# Patient Record
Sex: Female | Born: 1985 | Race: White | Hispanic: No | Marital: Single | Smoking: Former smoker
Health system: Southern US, Community
[De-identification: ages and names within clinical notes are randomized; demographics above are authoritative.]

## PROBLEM LIST (undated history)

## (undated) ENCOUNTER — Ambulatory Visit (HOSPITAL_COMMUNITY): Admission: EM | Payer: No Typology Code available for payment source | Source: Home / Self Care

## (undated) DIAGNOSIS — M069 Rheumatoid arthritis, unspecified: Secondary | ICD-10-CM

## (undated) DIAGNOSIS — M797 Fibromyalgia: Secondary | ICD-10-CM

## (undated) DIAGNOSIS — I00 Rheumatic fever without heart involvement: Secondary | ICD-10-CM

## (undated) DIAGNOSIS — F909 Attention-deficit hyperactivity disorder, unspecified type: Secondary | ICD-10-CM

## (undated) DIAGNOSIS — G43909 Migraine, unspecified, not intractable, without status migrainosus: Secondary | ICD-10-CM

## (undated) DIAGNOSIS — F419 Anxiety disorder, unspecified: Secondary | ICD-10-CM

## (undated) DIAGNOSIS — F329 Major depressive disorder, single episode, unspecified: Secondary | ICD-10-CM

## (undated) DIAGNOSIS — F32A Depression, unspecified: Secondary | ICD-10-CM

## (undated) DIAGNOSIS — F4310 Post-traumatic stress disorder, unspecified: Secondary | ICD-10-CM

## (undated) HISTORY — PX: OTHER SURGICAL HISTORY: SHX169

---
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
## 2005-01-16 ENCOUNTER — Other Ambulatory Visit: Admission: RE | Admit: 2005-01-16 | Discharge: 2005-01-16 | Payer: Self-pay | Admitting: Family Medicine

## 2005-04-20 ENCOUNTER — Emergency Department (HOSPITAL_COMMUNITY): Admission: EM | Admit: 2005-04-20 | Discharge: 2005-04-20 | Payer: Self-pay | Admitting: *Deleted

## 2014-08-24 ENCOUNTER — Emergency Department (HOSPITAL_COMMUNITY)
Admission: EM | Admit: 2014-08-24 | Discharge: 2014-08-24 | Disposition: A | Discharge status: Home / Self Care | Payer: Non-veteran care | Attending: Emergency Medicine | Admitting: Emergency Medicine

## 2014-08-24 ENCOUNTER — Encounter (HOSPITAL_COMMUNITY): Payer: Self-pay | Admitting: Emergency Medicine

## 2014-08-24 DIAGNOSIS — Z720 Tobacco use: Secondary | ICD-10-CM | POA: Diagnosis not present

## 2014-08-24 DIAGNOSIS — F419 Anxiety disorder, unspecified: Secondary | ICD-10-CM | POA: Insufficient documentation

## 2014-08-24 DIAGNOSIS — R52 Pain, unspecified: Secondary | ICD-10-CM | POA: Diagnosis present

## 2014-08-24 DIAGNOSIS — T43215A Adverse effect of selective serotonin and norepinephrine reuptake inhibitors, initial encounter: Secondary | ICD-10-CM | POA: Diagnosis not present

## 2014-08-24 DIAGNOSIS — Y9289 Other specified places as the place of occurrence of the external cause: Secondary | ICD-10-CM | POA: Insufficient documentation

## 2014-08-24 DIAGNOSIS — Z79899 Other long term (current) drug therapy: Secondary | ICD-10-CM | POA: Diagnosis not present

## 2014-08-24 DIAGNOSIS — R1110 Vomiting, unspecified: Secondary | ICD-10-CM | POA: Insufficient documentation

## 2014-08-24 DIAGNOSIS — F329 Major depressive disorder, single episode, unspecified: Secondary | ICD-10-CM | POA: Insufficient documentation

## 2014-08-24 DIAGNOSIS — Y998 Other external cause status: Secondary | ICD-10-CM | POA: Diagnosis not present

## 2014-08-24 DIAGNOSIS — Y9389 Activity, other specified: Secondary | ICD-10-CM | POA: Insufficient documentation

## 2014-08-24 DIAGNOSIS — T50905A Adverse effect of unspecified drugs, medicaments and biological substances, initial encounter: Secondary | ICD-10-CM

## 2014-08-24 HISTORY — DX: Major depressive disorder, single episode, unspecified: F32.9

## 2014-08-24 HISTORY — DX: Depression, unspecified: F32.A

## 2014-08-24 LAB — CBC
HCT: 38 % (ref 36.0–46.0)
Hemoglobin: 13.4 g/dL (ref 12.0–15.0)
MCH: 32.7 pg (ref 26.0–34.0)
MCHC: 35.3 g/dL (ref 30.0–36.0)
MCV: 92.7 fL (ref 78.0–100.0)
Platelets: 253 10*3/uL (ref 150–400)
RBC: 4.1 MIL/uL (ref 3.87–5.11)
RDW: 12.2 % (ref 11.5–15.5)
WBC: 8.9 10*3/uL (ref 4.0–10.5)

## 2014-08-24 LAB — COMPREHENSIVE METABOLIC PANEL
ALT: 27 U/L (ref 0–35)
AST: 22 U/L (ref 0–37)
Albumin: 4.1 g/dL (ref 3.5–5.2)
Alkaline Phosphatase: 66 U/L (ref 39–117)
Anion gap: 15 (ref 5–15)
BUN: 9 mg/dL (ref 6–23)
CO2: 21 mEq/L (ref 19–32)
Calcium: 9.2 mg/dL (ref 8.4–10.5)
Chloride: 98 mEq/L (ref 96–112)
Creatinine, Ser: 0.63 mg/dL (ref 0.50–1.10)
GFR calc Af Amer: >90 mL/min (ref >90)
GFR calc non Af Amer: >90 mL/min (ref >90)
Glucose, Bld: 91 mg/dL (ref 70–99)
Potassium: 3.8 mEq/L (ref 3.7–5.3)
Sodium: 134 mEq/L — ABNORMAL LOW (ref 137–147)
Total Bilirubin: 0.3 mg/dL (ref 0.3–1.2)
Total Protein: 7.6 g/dL (ref 6.0–8.3)

## 2014-08-24 MED ORDER — HYDROMORPHONE HCL 1 MG/ML IJ SOLN
1.0000 mg | Freq: Once | INTRAMUSCULAR | Status: AC
  Administered 2014-08-24: 1 mg via INTRAVENOUS
  Filled 2014-08-24: qty 1

## 2014-08-24 MED ORDER — SODIUM CHLORIDE 0.9 % IV BOLUS (SEPSIS)
1000.0000 mL | Freq: Once | INTRAVENOUS | Status: AC
  Administered 2014-08-24: 1000 mL via INTRAVENOUS

## 2014-08-24 MED ORDER — CLONAZEPAM 0.5 MG PO TABS: 0.5000 mg | ORAL_TABLET | Freq: Two times a day (BID) | ORAL | Status: DC | PRN

## 2014-08-24 NOTE — ED Notes (Signed)
Pt alert and oriented x4. Respirations even and unlabored, bilateral symmetrical rise and fall of chest. Skin warm and dry. In no acute distress. Denies needs.   

## 2014-08-24 NOTE — ED Provider Notes (Signed)
CSN: [id number]     Arrival date & time 08/24/14  1355 History   First MD Initiated Contact with Patient 08/24/14 1731     Chief Complaint  Patient presents with  . Medication Reaction      HPI  Expand All Collapse All   Pt was being treated for PTSD and anxiety, has been on effexor, from Oct 11-Nov 9 pt was weaning herself off effexor per md, pt stopped effexor Nov 9, started Cymbalta Nov 11, pt had bad reaction. Pt did not take cymbalta anymore per md, pt started back on cymbalta 30mg  today. Took medicine at 0730, around 0900 pt started having pain from head to toes, confusion, disorientation, stomach cramping, and vomited x3      Past Medical History  Diagnosis Date  . Depression    History reviewed. No pertinent past surgical history. No family history on file. History  Substance Use Topics  . Smoking status: Current Every Day Smoker  . Smokeless tobacco: Not on file  . Alcohol Use: Yes   OB History    No data available     Review of Systems  All other systems reviewed and are negative  Allergies  Celebrex  Home Medications   Prior to Admission medications   Medication Sig Start Date End Date Taking? Authorizing Provider  DULoxetine (CYMBALTA) 30 MG capsule Take 30 mg by mouth daily.   Yes Historical Provider, MD  DULoxetine (CYMBALTA) 60 MG capsule Take 60 mg by mouth daily.   Yes Historical Provider, MD  etonogestrel (NEXPLANON) 68 MG IMPL implant 1 each by Subdermal route once. 05/2013   Yes Historical Provider, MD  clonazePAM (KLONOPIN) 0.5 MG tablet Take 1 tablet (0.5 mg total) by mouth 2 (two) times daily as needed for anxiety. 08/24/14   08/26/14, MD   BP 117/70 mmHg  Pulse 104  Temp(Src) 98.5 F (36.9 C) (Oral)  Resp 18  SpO2 100% Physical Exam Physical Exam  Nursing note and vitals reviewed. Constitutional: She is oriented to person, place, and time. She appears well-developed and well-nourished. No distress.  HENT:  Head: Normocephalic  and atraumatic.  Eyes: Pupils are equal, round, and reactive to light.  Neck: Normal range of motion.  Cardiovascular: Normal rate and intact distal pulses.   Pulmonary/Chest: No respiratory distress.  Abdominal: Normal appearance. She exhibits no distension.  Musculoskeletal: Normal range of motion.  Neurological: She is alert and oriented to person, place, and time. No cranial nerve deficit.  Skin: Skin is warm and dry. No rash noted.  Psychiatric: She has a normal mood and affect. Her behavior is normal.  no homicidal or suicidal ideation.  ED Course  Procedures (including critical care time) Labs Review Labs Reviewed  COMPREHENSIVE METABOLIC PANEL - Abnormal; Notable for the following:    Sodium 134 (*)    All other components within normal limits  CBC    Imaging Review No results found.   EKG Interpretation None      MDM   Final diagnoses:  Drug reaction, initial encounter        Nelia Shi, MD 08/30/14 2203

## 2014-08-24 NOTE — Progress Notes (Signed)
  CARE MANAGEMENT ED NOTE 08/24/2014  Patient:  CORRENA, MEACHAM   Account Number:  [id number]  Date Initiated:  08/24/2014  Documentation initiated by:  Radford Pax  Subjective/Objective Assessment:   Patient presents to Ed with questionable medication reaction     Subjective/Objective Assessment Detail:   Patient with pmhx of anxiety, depression and PTSD     Action/Plan:   Action/Plan Detail:   Anticipated DC Date:  08/24/2014     Status Recommendation to Physician:   Result of Recommendation:    Other ED Services  Consult Working Plan    DC Planning Services  Other  PCP issues    Choice offered to / List presented to:            Status of service:  Completed, signed off  ED Comments:   ED Comments Detail:  EDCM spoke to patient and patient's mother at bedside. Patient confirms she is being seen at [facility] in [place].  Her pcp at the [facility] is Dr. Kae Heller.  Patient requesting list of pcps who accept [employer] insurnace.  EDCM provided patient with a list of pcps who accept BCBS insurnace within a ten mile radius of patient's zip code.  Patient thankful for resources.  No further EDCM needs at this time.

## 2014-08-24 NOTE — ED Notes (Signed)
Per pt states she has been coming off effexor for a month and placed on cymbalta-states she has been confused and vomiting on and off since

## 2014-08-24 NOTE — ED Notes (Signed)
Pt was being treated for PTSD and anxiety, has been on effexor, from Oct 11-Nov 9 pt was weaning herself off effexor per md, pt stopped effexor Nov 9, started Cymbalta Nov 11, pt had bad reaction. Pt did not take cymbalta anymore per md, pt started back on cymbalta 30mg  today. Took medicine at 0730, around 0900 pt started having pain from head to toes, confusion, disorientation, stomach cramping, and vomited x3. Pain 6/10.

## 2014-08-24 NOTE — ED Notes (Signed)
Pt escorted to discharge window. Pt verbalized understanding discharge instructions. In no acute distress.  

## 2014-08-24 NOTE — ED Notes (Signed)
md at bedside

## 2016-02-07 ENCOUNTER — Encounter: Payer: Self-pay | Admitting: Advanced Practice Midwife

## 2016-08-29 ENCOUNTER — Emergency Department (HOSPITAL_COMMUNITY)
Admission: EM | Admit: 2016-08-29 | Discharge: 2016-08-30 | Disposition: A | Discharge status: Home / Self Care | Payer: Medicaid Other | Attending: Emergency Medicine | Admitting: Emergency Medicine

## 2016-08-29 ENCOUNTER — Encounter (HOSPITAL_COMMUNITY): Payer: Self-pay | Admitting: Emergency Medicine

## 2016-08-29 DIAGNOSIS — J029 Acute pharyngitis, unspecified: Secondary | ICD-10-CM | POA: Diagnosis present

## 2016-08-29 DIAGNOSIS — Z79899 Other long term (current) drug therapy: Secondary | ICD-10-CM | POA: Insufficient documentation

## 2016-08-29 DIAGNOSIS — F17210 Nicotine dependence, cigarettes, uncomplicated: Secondary | ICD-10-CM | POA: Diagnosis not present

## 2016-08-29 DIAGNOSIS — J020 Streptococcal pharyngitis: Secondary | ICD-10-CM | POA: Diagnosis not present

## 2016-08-29 HISTORY — DX: Post-traumatic stress disorder, unspecified: F43.10

## 2016-08-29 LAB — RAPID STREP SCREEN (MED CTR MEBANE ONLY): STREPTOCOCCUS, GROUP A SCREEN (DIRECT): POSITIVE — AB

## 2016-08-29 MED ORDER — PENICILLIN G BENZATHINE 1200000 UNIT/2ML IM SUSP
1.2000 10*6.[IU] | Freq: Once | INTRAMUSCULAR | Status: AC
  Administered 2016-08-29: 1.2 10*6.[IU] via INTRAMUSCULAR
  Filled 2016-08-29: qty 2

## 2016-08-29 NOTE — ED Triage Notes (Signed)
Pt reports sore throat and body aches that started this am.

## 2016-08-29 NOTE — ED Provider Notes (Addendum)
[facility]-EMERGENCY DEPT Provider Note   CSN: [id number] Arrival date & time: 08/29/16  2204     History   Chief Complaint Chief Complaint  Patient presents with  . Sore Throat    HPI Barbara Koch is a 30 y.o. female.  The history is provided by the patient. No language interpreter was used.  Sore Throat  This is a new problem. The current episode started 12 to 24 hours ago. The problem occurs constantly. The problem has been rapidly worsening. Pertinent negatives include no chest pain. Nothing aggravates the symptoms. Nothing relieves the symptoms. She has tried nothing for the symptoms. The treatment provided no relief.  Pt complains of a sore throat  Past Medical History:  Diagnosis Date  . Depression   . PTSD (post-traumatic stress disorder)     There are no active problems to display for this patient.   History reviewed. No pertinent surgical history.  OB History    Gravida Para Term Preterm AB Living   3 1 1   2      SAB TAB Ectopic Multiple Live Births     2             Home Medications    Prior to Admission medications   Medication Sig Start Date End Date Taking? Authorizing Provider  clonazePAM (KLONOPIN) 0.5 MG tablet Take 1 tablet (0.5 mg total) by mouth 2 (two) times daily as needed for anxiety. 08/24/14   08/26/14, MD  DULoxetine (CYMBALTA) 30 MG capsule Take 30 mg by mouth daily.    Historical Provider, MD  DULoxetine (CYMBALTA) 60 MG capsule Take 60 mg by mouth daily.    Historical Provider, MD  etonogestrel (NEXPLANON) 68 MG IMPL implant 1 each by Subdermal route once. 05/2013    Historical Provider, MD    Family History Family History  Problem Relation Age of Onset  . Depression Mother   . Cancer Father   . Cancer Other   . Diabetes Other     Social History Social History  Substance Use Topics  . Smoking status: Current Every Day Smoker    Packs/day: 1.00    Types: Cigarettes  . Smokeless tobacco: Never Used  . Alcohol use Yes     Comment: occas     Allergies   Celebrex [celecoxib]   Review of Systems Review of Systems  Cardiovascular: Negative for chest pain.  All other systems reviewed and are negative.    Physical Exam Updated Vital Signs BP 138/83   Pulse 118   Temp 100.3 F (37.9 C)   Resp 20   Ht 5' (1.524 m)   Wt 81.6 kg   SpO2 100%   BMI 35.15 kg/m   Physical Exam  Constitutional: She appears well-developed and well-nourished.  HENT:  Head: Normocephalic.  Right Ear: External ear normal.  Left Ear: External ear normal.  Nose: Nose normal.  Erythema pharynx.    Eyes: EOM are normal. Pupils are equal, round, and reactive to light.  Neck: Normal range of motion.  Cardiovascular: Normal rate.   Pulmonary/Chest: Effort normal.  Abdominal: Soft.  Musculoskeletal: Normal range of motion.  Neurological: She is alert.  Skin: Skin is warm. Capillary refill takes less than 2 seconds.  Psychiatric: She has a normal mood and affect.  Nursing note and vitals reviewed.    ED Treatments / Results  Labs (all labs ordered are listed, but only abnormal results are displayed) Labs Reviewed  RAPID STREP SCREEN (NOT AT [facility]) -  Abnormal; Notable for the following:       Result Value   Streptococcus, Group A Screen (Direct) POSITIVE (*)    All other components within normal limits    EKG  EKG Interpretation None       Radiology No results found.  Procedures Procedures (including critical care time)  Medications Ordered in ED Medications  penicillin g benzathine (BICILLIN LA) 1200000 UNIT/2ML injection 1.2 Million Units (not administered)     Initial Impression / Assessment and Plan / ED Course  I have reviewed the triage vital signs and the nursing notes.  Pertinent labs & imaging results that were available during my care of the patient were reviewed by me and considered in my medical decision making (see chart for details).  Clinical Course     Strep positive  Final  Clinical Impressions(s) / ED Diagnoses   Final diagnoses:  Strep pharyngitis    New Prescriptions New Prescriptions   No medications on file  [facility]   Elson Areas, PA-C 08/29/16 2325    Glynn Octave, MD 08/30/16 0257    Elson Areas, PA-C 09/04/16 1135    Glynn Octave, MD 09/04/16 2235

## 2016-08-29 NOTE — Discharge Instructions (Signed)
Return if any problems.

## 2017-10-26 ENCOUNTER — Encounter: Payer: Self-pay | Admitting: *Deleted

## 2017-10-26 ENCOUNTER — Telehealth: Payer: Self-pay | Admitting: *Deleted

## 2017-10-26 NOTE — Telephone Encounter (Signed)
Message sent on wrong patient.

## 2017-11-09 ENCOUNTER — Encounter: Payer: Self-pay | Admitting: Obstetrics and Gynecology

## 2017-11-09 ENCOUNTER — Ambulatory Visit (INDEPENDENT_AMBULATORY_CARE_PROVIDER_SITE_OTHER): Payer: Medicaid Other | Admitting: Obstetrics and Gynecology

## 2017-11-09 ENCOUNTER — Other Ambulatory Visit: Payer: Self-pay | Admitting: Obstetrics and Gynecology

## 2017-11-09 VITALS — BP 120/90 | HR 98 | Ht 60.0 in | Wt 171.6 lb

## 2017-11-09 DIAGNOSIS — Z3202 Encounter for pregnancy test, result negative: Secondary | ICD-10-CM | POA: Diagnosis not present

## 2017-11-09 DIAGNOSIS — R87612 Low grade squamous intraepithelial lesion on cytologic smear of cervix (LGSIL): Secondary | ICD-10-CM

## 2017-11-09 LAB — POCT URINE PREGNANCY: Preg Test, Ur: NEGATIVE

## 2017-11-09 NOTE — Progress Notes (Signed)
  Barbara Koch 32 y.o. [id number] former [employer] with history of ASCUS Pap in 2016 and recent Pap showing L SIL here for colposcopy for low-grade squamous intraepithelial neoplasia (LGSIL - encompassing HPV,mild dysplasia,CIN I) pap smear on late 2018.  Discussed role for HPV in cervical dysplasia, need for surveillance.  Patient given informed consent, signed copy in the chart, time out was performed.  Placed in lithotomy position. Cervix viewed with speculum and colposcope after application of acetic acid.   Colposcopy adequate? Yes multiparous cervix able to view endocervical canal well ECC done  visible lesion(s) at 12 o'clock; biopsies obtained at 12.   ECC specimen obtained.yes All specimens were labelled and sent to pathology.  Colposcopy IMPRESSION:  Patient was given post procedure instructions. Will follow up pathology and manage accordingly.  Routine preventative health maintenance measures emphasized. Pt to have f/u  1 wk. To discuss results.

## 2017-11-13 ENCOUNTER — Encounter: Payer: Self-pay | Admitting: Obstetrics and Gynecology

## 2017-11-25 ENCOUNTER — Ambulatory Visit: Payer: Medicaid Other | Admitting: Obstetrics and Gynecology

## 2018-06-06 DIAGNOSIS — I00 Rheumatic fever without heart involvement: Secondary | ICD-10-CM

## 2018-06-06 HISTORY — DX: Rheumatic fever without heart involvement: I00

## 2018-09-01 ENCOUNTER — Other Ambulatory Visit: Payer: Self-pay

## 2018-09-01 ENCOUNTER — Emergency Department (HOSPITAL_COMMUNITY): Payer: Medicaid Other

## 2018-09-01 ENCOUNTER — Emergency Department (HOSPITAL_COMMUNITY)
Admission: EM | Admit: 2018-09-01 | Discharge: 2018-09-01 | Disposition: A | Discharge status: Home / Self Care | Payer: Medicaid Other | Attending: Emergency Medicine | Admitting: Emergency Medicine

## 2018-09-01 ENCOUNTER — Encounter (HOSPITAL_COMMUNITY): Payer: Self-pay

## 2018-09-01 DIAGNOSIS — M25512 Pain in left shoulder: Secondary | ICD-10-CM | POA: Diagnosis not present

## 2018-09-01 DIAGNOSIS — M069 Rheumatoid arthritis, unspecified: Secondary | ICD-10-CM | POA: Diagnosis not present

## 2018-09-01 DIAGNOSIS — Z79899 Other long term (current) drug therapy: Secondary | ICD-10-CM | POA: Insufficient documentation

## 2018-09-01 DIAGNOSIS — F17210 Nicotine dependence, cigarettes, uncomplicated: Secondary | ICD-10-CM | POA: Diagnosis not present

## 2018-09-01 HISTORY — DX: Migraine, unspecified, not intractable, without status migrainosus: G43.909

## 2018-09-01 HISTORY — DX: Anxiety disorder, unspecified: F41.9

## 2018-09-01 HISTORY — DX: Rheumatoid arthritis, unspecified: M06.9

## 2018-09-01 HISTORY — DX: Attention-deficit hyperactivity disorder, unspecified type: F90.9

## 2018-09-01 MED ORDER — IBUPROFEN 800 MG PO TABS: 800.0000 mg | ORAL_TABLET | Freq: Three times a day (TID) | ORAL | 0 refills | Status: DC

## 2018-09-01 MED ORDER — HYDROCODONE-ACETAMINOPHEN 5-325 MG PO TABS: ORAL_TABLET | ORAL | 0 refills | Status: DC

## 2018-09-01 NOTE — ED Triage Notes (Signed)
Pt reports right shoulder pain x 4 days. No known injury.

## 2018-09-01 NOTE — Discharge Instructions (Addendum)
Continue to alternate ice and heat to your shoulder.  Only wear the sling for a few hours at a time and do not wear continuously.  Call the orthopedic provider listed to arrange a follow-up appointment if not improving in a few days.

## 2018-09-02 NOTE — ED Provider Notes (Signed)
[facility] EMERGENCY DEPARTMENT Provider Note   CSN: [id number] Arrival date & time: 09/01/18  1356     History   Chief Complaint Chief Complaint  Patient presents with  . Shoulder Pain    HPI Barbara Koch is a 32 y.o. female.  HPI   Barbara Koch is a 32 y.o. female who presents to the Emergency Department complaining of shoulder pain for 4 to 5 days.  She describes the pain as a constant aching pain of her shoulder that is worse with movement of her right arm.  She denies known injury, neck pain, numbness or tingling of her arm or fingers.  Pain does radiate into her right upper arm at times.  She is tried over-the-counter pain relievers without improvement.  No headaches or dizziness or skin changes.   Past Medical History:  Diagnosis Date  . ADHD   . Anxiety   . Depression   . Migraines   . PTSD (post-traumatic stress disorder)   . Rheumatoid arthritis (HCC)     There are no active problems to display for this patient.   Past Surgical History:  Procedure Laterality Date  . dislocated shoulder     right     OB History    Gravida  3   Para  1   Term  1   Preterm      AB  2   Living        SAB      TAB  2   Ectopic      Multiple      Live Births               Home Medications    Prior to Admission medications   Medication Sig Start Date End Date Taking? Authorizing Provider  clonazePAM (KLONOPIN) 0.5 MG tablet Take 1 tablet (0.5 mg total) by mouth 2 (two) times daily as needed for anxiety. 08/24/14   Nelva Nay, MD  DULoxetine (CYMBALTA) 30 MG capsule Take 30 mg by mouth daily.    [provider]  DULoxetine (CYMBALTA) 60 MG capsule Take 60 mg by mouth daily.    [provider]  etonogestrel (NEXPLANON) 68 MG IMPL implant 1 each by Subdermal route once. 05/2013    [provider]  HYDROcodone-acetaminophen (NORCO/VICODIN) 5-325 MG tablet Take one tab po q 4 hrs prn pain 09/01/18   Jerri Glauser,  PA-C  ibuprofen (ADVIL,MOTRIN) 800 MG tablet Take 1 tablet (800 mg total) by mouth 3 (three) times daily. 09/01/18   Mandeep Kiser, PA-C  lisdexamfetamine (VYVANSE) 40 MG capsule Take 40 mg by mouth every morning.    [provider]    Family History Family History  Problem Relation Age of Onset  . Depression Mother   . Cancer Father   . Cancer Other   . Diabetes Other     Social History Social History   Tobacco Use  . Smoking status: Current Every Day Smoker    Packs/day: 0.50    Types: Cigarettes  . Smokeless tobacco: Never Used  Substance Use Topics  . Alcohol use: Yes    Comment: occas  . Drug use: No     Allergies   Celebrex [celecoxib] and Sulfa antibiotics   Review of Systems Review of Systems  Constitutional: Negative for chills and fever.  Respiratory: Negative for chest tightness and shortness of breath.   Cardiovascular: Negative for chest pain.  Musculoskeletal: Positive for arthralgias (Right shoulder pain). Negative for  joint swelling.  Skin: Negative for color change and wound.  Neurological: Negative for dizziness, weakness, numbness and headaches.     Physical Exam Updated Vital Signs BP (!) 134/98   Pulse 83   Temp 98.6 F (37 C) (Oral)   Resp 16   Ht 5' (1.524 m)   Wt 79.4 kg   SpO2 97%   BMI 34.18 kg/m   Physical Exam  Constitutional: She appears well-nourished. No distress.  HENT:  Head: Atraumatic.  Mouth/Throat: Oropharynx is clear and moist.  Neck: Normal range of motion, full passive range of motion without pain and phonation normal. No spinous process tenderness and no muscular tenderness present. Normal range of motion present.  Cardiovascular: Normal rate, regular rhythm and intact distal pulses.  Pulmonary/Chest: Effort normal and breath sounds normal. No respiratory distress. She exhibits no tenderness.  Musculoskeletal: Normal range of motion.       Right shoulder: She exhibits tenderness. She exhibits no bony  tenderness, no swelling, no deformity, normal pulse and normal strength.  Tender to palpation of the right AC joint.  No bony deformity. Mild tenderness of the right trapezius muscle. No edema.  No cervical spine tenderness.  Grip strength strong and symmetrical  Neurological: She is alert. No sensory deficit.  Skin: Skin is warm. No rash noted.  Nursing note and vitals reviewed.    ED Treatments / Results  Labs (all labs ordered are listed, but only abnormal results are displayed) Labs Reviewed - No data to display  EKG None  Radiology Dg Shoulder Right  Result Date: 09/01/2018 CLINICAL DATA:  Right shoulder pain and limited range of motion for 4 days. EXAM: RIGHT SHOULDER - 2+ VIEW COMPARISON:  None. FINDINGS: There is no evidence of fracture or dislocation. There is no evidence of arthropathy or other focal bone abnormality. Soft tissues are unremarkable. IMPRESSION: Negative right shoulder radiographs. Electronically Signed   By: Marin Roberts M.D.   On: 09/01/2018 14:54    Procedures Procedures (including critical care time)  Medications Ordered in ED Medications - No data to display   Initial Impression / Assessment and Plan / ED Course  I have reviewed the triage vital signs and the nursing notes.  Pertinent labs & imaging results that were available during my care of the patient were reviewed by me and considered in my medical decision making (see chart for details).     Pt with right shoulder pain.  NV intact.  No concerning sx's for septic joint.  No skin changes.  XR neg.  Likely inflammatory.  Pt agrees to tx plan and orthopedic f/u.    Final Clinical Impressions(s) / ED Diagnoses   Final diagnoses:  Acute pain of left shoulder    ED Discharge Orders         Ordered    ibuprofen (ADVIL,MOTRIN) 800 MG tablet  3 times daily     09/01/18 1519    HYDROcodone-acetaminophen (NORCO/VICODIN) 5-325 MG tablet     09/01/18 1519           Pauline Aus, PA-C 09/02/18 2219    Donnetta Hutching, MD 09/03/18 [phone]

## 2019-02-09 ENCOUNTER — Encounter (HOSPITAL_COMMUNITY): Payer: Self-pay | Admitting: Emergency Medicine

## 2019-02-09 ENCOUNTER — Emergency Department (HOSPITAL_COMMUNITY)
Admission: EM | Admit: 2019-02-09 | Discharge: 2019-02-09 | Disposition: A | Discharge status: Home / Self Care | Payer: Medicaid Other | Attending: Emergency Medicine | Admitting: Emergency Medicine

## 2019-02-09 ENCOUNTER — Other Ambulatory Visit: Payer: Self-pay

## 2019-02-09 DIAGNOSIS — F17210 Nicotine dependence, cigarettes, uncomplicated: Secondary | ICD-10-CM | POA: Insufficient documentation

## 2019-02-09 DIAGNOSIS — J329 Chronic sinusitis, unspecified: Secondary | ICD-10-CM | POA: Diagnosis not present

## 2019-02-09 DIAGNOSIS — J029 Acute pharyngitis, unspecified: Secondary | ICD-10-CM | POA: Diagnosis present

## 2019-02-09 DIAGNOSIS — Z79899 Other long term (current) drug therapy: Secondary | ICD-10-CM | POA: Diagnosis not present

## 2019-02-09 HISTORY — DX: Rheumatic fever without heart involvement: I00

## 2019-02-09 LAB — GROUP A STREP BY PCR: Group A Strep by PCR: NOT DETECTED

## 2019-02-09 MED ORDER — PREDNISONE 20 MG PO TABS
40.0000 mg | ORAL_TABLET | Freq: Once | ORAL | Status: AC
  Administered 2019-02-09: 40 mg via ORAL
  Filled 2019-02-09: qty 2

## 2019-02-09 MED ORDER — DEXAMETHASONE 4 MG PO TABS: 4.0000 mg | ORAL_TABLET | Freq: Two times a day (BID) | ORAL | 0 refills | Status: DC

## 2019-02-09 MED ORDER — ACETAMINOPHEN 500 MG PO TABS
1000.0000 mg | ORAL_TABLET | Freq: Once | ORAL | Status: AC
  Administered 2019-02-09: 1000 mg via ORAL
  Filled 2019-02-09: qty 2

## 2019-02-09 MED ORDER — PSEUDOEPHEDRINE HCL 60 MG PO TABS
60.0000 mg | ORAL_TABLET | Freq: Once | ORAL | Status: AC
  Administered 2019-02-09: 60 mg via ORAL
  Filled 2019-02-09: qty 1

## 2019-02-09 NOTE — ED Provider Notes (Signed)
[facility] EMERGENCY DEPARTMENT Provider Note   CSN: [id number] Arrival date & time: 02/09/19  2144    History   Chief Complaint Chief Complaint  Patient presents with  . Sore Throat    HPI Barbara Koch is a 33 y.o. female.      Sore Throat  This is a new problem. The current episode started more than 2 days ago. The problem occurs daily. The problem has been gradually worsening. Pertinent negatives include no chest pain, no abdominal pain and no shortness of breath. The symptoms are aggravated by swallowing. Nothing relieves the symptoms. She has tried acetaminophen for the symptoms. The treatment provided no relief.    Past Medical History:  Diagnosis Date  . ADHD   . Anxiety   . Depression   . Migraines   . PTSD (post-traumatic stress disorder)   . 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 Rheumatoid arthritis (HCC)     There are no active problems to display for this patient.   Past Surgical History:  Procedure Laterality Date  . dislocated shoulder     right     OB History    Gravida  3   Para  1   Term  1   Preterm      AB  2   Living        SAB      TAB  2   Ectopic      Multiple      Live Births               Home Medications    Prior to Admission medications   Medication Sig Start Date End Date Taking? Authorizing Provider  clonazePAM (KLONOPIN) 0.5 MG tablet Take 1 tablet (0.5 mg total) by mouth 2 (two) times daily as needed for anxiety. 08/24/14   Nelva Nay, MD  cloNIDine (CATAPRES) 0.1 MG tablet TAKE 1 TABLET BY MOUTH AT BEDTIME FOR SLEEP 01/25/19   [provider]  DULoxetine (CYMBALTA) 30 MG capsule Take 30 mg by mouth daily.    [provider]  DULoxetine (CYMBALTA) 60 MG capsule Take 60 mg by mouth daily.    [provider]  etonogestrel (NEXPLANON) 68 MG IMPL implant 1 each by Subdermal route once. 05/2013    [provider]  gabapentin (NEURONTIN) 300 MG capsule TAKE 1 CAPSULE BY MOUTH  TWICE DAILY FOR ANXIETY 01/25/19   [provider]  HYDROcodone-acetaminophen (NORCO/VICODIN) 5-325 MG tablet Take one tab po q 4 hrs prn pain 09/01/18   Triplett, Tammy, PA-C  ibuprofen (ADVIL,MOTRIN) 800 MG tablet Take 1 tablet (800 mg total) by mouth 3 (three) times daily. 09/01/18   Triplett, Tammy, PA-C  lisdexamfetamine (VYVANSE) 40 MG capsule Take 40 mg by mouth every morning.    [provider]  REXULTI 1 MG TABS TAKE 1 TABLET BY MOUTH AT BEDTIME FOR DEPRESSION 12/16/18   [provider]    Family History Family History  Problem Relation Age of Onset  . Depression Mother   . Cancer Father   . Cancer Other   . Diabetes Other     Social History Social History   Tobacco Use  . Smoking status: Current Every Day Smoker    Packs/day: 0.50    Types: Cigarettes  . Smokeless tobacco: Never Used  Substance Use Topics  . Alcohol use: Yes    Comment: occas  . Drug use: No     Allergies   Celebrex [celecoxib] and Sulfa  antibiotics   Review of Systems Review of Systems  Constitutional: Negative for activity change, appetite change, chills and fever.       All ROS Neg except as noted in HPI  HENT: Positive for congestion and sore throat. Negative for nosebleeds.   Eyes: Negative for photophobia and discharge.  Respiratory: Negative for cough, shortness of breath and wheezing.   Cardiovascular: Negative for chest pain and palpitations.  Gastrointestinal: Negative for abdominal pain and blood in stool.  Genitourinary: Negative for dysuria, frequency and hematuria.  Musculoskeletal: Negative for arthralgias, back pain and neck pain.  Skin: Negative.   Neurological: Negative for dizziness, seizures and speech difficulty.  Psychiatric/Behavioral: Negative for confusion and hallucinations.     Physical Exam Updated Vital Signs BP (!) 146/88 (BP Location: Right Arm)   Pulse 93   Temp 98.9 F (37.2 C) (Oral)   Resp 17   Ht 5' (1.524 m)   Wt 81.6  kg   SpO2 98%   BMI 35.15 kg/m   Physical Exam Vitals signs and nursing note reviewed.  Constitutional:      Appearance: She is well-developed. She is not toxic-appearing.  HENT:     Head: Normocephalic.     Right Ear: Tympanic membrane and external ear normal. Tympanic membrane is not erythematous.     Left Ear: Tympanic membrane and external ear normal. Tenderness present. Tympanic membrane is not erythematous.     Nose: Congestion present.     Mouth/Throat:     Pharynx: Uvula midline. Posterior oropharyngeal erythema present.  Eyes:     General: Lids are normal.     Pupils: Pupils are equal, round, and reactive to light.  Neck:     Musculoskeletal: Normal range of motion and neck supple.     Vascular: No carotid bruit.  Cardiovascular:     Rate and Rhythm: Normal rate and regular rhythm.     Pulses: Normal pulses.     Heart sounds: Normal heart sounds.  Pulmonary:     Effort: No respiratory distress.     Breath sounds: Normal breath sounds.  Abdominal:     General: Bowel sounds are normal.     Palpations: Abdomen is soft.     Tenderness: There is no abdominal tenderness. There is no guarding.  Musculoskeletal: Normal range of motion.  Lymphadenopathy:     Head:     Right side of head: No submandibular adenopathy.     Left side of head: No submandibular adenopathy.     Cervical: No cervical adenopathy.  Skin:    General: Skin is warm and dry.  Neurological:     Mental Status: She is alert and oriented to person, place, and time.     Cranial Nerves: No cranial nerve deficit.     Sensory: No sensory deficit.  Psychiatric:        Speech: Speech normal.      ED Treatments / Results  Labs (all labs ordered are listed, but only abnormal results are displayed) Labs Reviewed  GROUP A STREP BY PCR    EKG None  Radiology No results found.  Procedures Procedures (including critical care time)  Medications Ordered in ED Medications - No data to display    Initial Impression / Assessment and Plan / ED Course  I have reviewed the triage vital signs and the nursing notes.  Pertinent labs & imaging results that were available during my care of the patient were reviewed by me and considered in my medical  decision making (see chart for details).          Final Clinical Impressions(s) / ED Diagnoses MDM  Vital signs reviewed.  Pulse oximetry is 98% on room air.  Within normal limits by my interpretation.  The strep test is negative.  There is noted some nasal congestion on examination.  There is mild to moderate redness of the posterior pharynx.  No exudate noted.  The airway is patent.  The speech is understandable.  The examination suggest sinusitis and pharyngitis.  I have asked the patient to use her mask.  Of asked her to wash hands frequently.  She will use salt water gargles and Chloraseptic spray.  She will use Tylenol every 4 hours.  I have asked her to use Claritin-D every 12 hours, and have given her prescription for 4 days of steroid medication to assist with her sinusitis and pharyngitis issue.  Patient is in agreement with this plan.  Patient will return to the emergency department if any changes in her condition, problems, or concerns.   Final diagnoses:  Pharyngitis, unspecified etiology  Other sinusitis, unspecified chronicity    ED Discharge Orders    None       Ivery Quale, PA-C 02/10/19 0026    Cathren Laine, MD 02/10/19 1259

## 2019-02-09 NOTE — ED Triage Notes (Addendum)
Pt c/o sore throat and bilateral ear pain x 1 week, pt denies fever, reports some fatigue and body aches

## 2019-02-09 NOTE — Discharge Instructions (Signed)
Your strep test is negative.  Your vital signs are within normal limits.  Your oxygen level is within normal limits.  Please use Claritin-D every 12 hours for congestion, and to relieve some of the pressure off of your ears.  Please use Decadron 2 times daily.  Salt water gargles as needed, and Chloraseptic spray as needed for assistance with your throat.  Use Tylenol extra strength every 4 hours for soreness or discomfort.

## 2019-03-17 ENCOUNTER — Telehealth: Payer: Self-pay | Admitting: Orthopedic Surgery

## 2019-03-17 NOTE — Telephone Encounter (Signed)
Patient called about a left shoulder problem which recurred from not necessarily an injury. States similar problem in November which records indicate seen at [facility] emergency room, Xrays done then. Discussed and offered appointment, and also insurances - [place] and [employer], which would require referrals. Medicaid card indicates a practice, [facility], which patient said she has never been seen at.  Will check with insurances, primary care, and [facility], and will call back.

## 2019-09-12 ENCOUNTER — Telehealth: Payer: Medicaid Other

## 2019-09-12 ENCOUNTER — Ambulatory Visit (INDEPENDENT_AMBULATORY_CARE_PROVIDER_SITE_OTHER)
Admission: RE | Admit: 2019-09-12 | Discharge: 2019-09-12 | Disposition: A | Discharge status: Home / Self Care | Payer: Medicaid Other | Source: Ambulatory Visit

## 2019-09-12 ENCOUNTER — Other Ambulatory Visit: Payer: Self-pay

## 2019-09-12 DIAGNOSIS — M797 Fibromyalgia: Secondary | ICD-10-CM

## 2019-09-12 DIAGNOSIS — G44029 Chronic cluster headache, not intractable: Secondary | ICD-10-CM | POA: Diagnosis not present

## 2019-09-12 DIAGNOSIS — M069 Rheumatoid arthritis, unspecified: Secondary | ICD-10-CM | POA: Diagnosis not present

## 2019-09-12 MED ORDER — PREDNISONE 10 MG (21) PO TBPK: ORAL_TABLET | Freq: Every day | ORAL | 0 refills | Status: DC

## 2019-09-12 MED ORDER — DICLOFENAC POTASSIUM(MIGRAINE) 50 MG PO PACK: 50.0000 mg | PACK | Freq: Two times a day (BID) | ORAL | 0 refills | Status: DC | PRN

## 2019-09-12 MED ORDER — CYCLOBENZAPRINE HCL 5 MG PO TABS: 5.0000 mg | ORAL_TABLET | Freq: Every day | ORAL | 0 refills | Status: DC

## 2019-09-12 NOTE — Discharge Instructions (Signed)
May complete course of prednisone.  Flexeril at night as needed for tension, don't take with clonazepam.  Once prednisone is complete may use diclofenac for breakthrough pain. This is an antiinflammatory, take with food.  Please follow up with your primary care provider for recheck.  Please be seen in person for any worsening of symptoms.

## 2019-09-12 NOTE — ED Provider Notes (Signed)
Virtual Visit via Video Note:  Barbara Koch  initiated request for Telemedicine visit with [facility] team. I connected with Barbara Koch  on 09/12/2019 at 12:40 PM  for a synchronized telemedicine visit using a video enabled HIPPA compliant telemedicine application. I verified that I am speaking with Barbara Koch  using two identifiers. Zigmund Gottron, NP  was physically located in a [facility] Urgent care site and Barbara Koch was located at a different location.   The limitations of evaluation and management by telemedicine as well as the availability of in-person appointments were discussed. Patient was informed that she  may incur a bill ( including co-pay) for this virtual visit encounter. Barbara Koch  expressed understanding and gave verbal consent to proceed with virtual visit.     History of Present Illness:Barbara Koch  is a 33 y.o. female presents with complaints of cluster headaches. History of these, especially with season changes. History of fibromyalgia and RA as well. Has had a difficult time getting in to see her specialists. States it has been a while since her last episode of cluster headaches. She does take cymbalta which does seem to help. States has a chronic headache which never stops. However it has gotten more intense. Has been drinking more water. Limits ibuprofen to prevent rebound headache. Pain to both temples and across front of head. 1 week of this worse headache. Follows with Dr. Domingo Cocking. Some nausea, some aura, light and sound sensitivity. Fibromyalgia pain is generalized and everywhere. RA is primarily worse to her back, but also with hand pain. Hasn't been taking any allergy medications, denies sneezing or itchy eyes. Dizzy when pain is significantly worse, denies now. Eyes feel tired but no vision loss. Headache is similar to what she has experienced in the past. Prednisone and diclofenac  have helped in the past for similar episodes.   Past  Medical History:  Diagnosis Date  . ADHD   . Anxiety   . Depression   . Migraines   . PTSD (post-traumatic stress disorder)   . 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 Rheumatoid arthritis (HCC)     Allergies  Allergen Reactions  . Celebrex [Celecoxib] Hives    Can take other NSAIDs  . Sulfa Antibiotics     Hives         Observations/Objective: Alert, oriented, non toxic in appearance. Clear coherent speech without difficulty. No increased work of breathing visualized.  Eyes tracking normally, no obvious neurological findings.   Assessment and Plan: Prednisone pack now. May try flexeril at night, although not to be taken with clonazepam. Once prednisone is complete may use diclofenac for breakthrough pain. Encouraged follow up with her PCP. Return precautions provided. Patient verbalized understanding and agreeable to plan.    Follow Up Instructions:    I discussed the assessment and treatment plan with the patient. The patient was provided an opportunity to ask questions and all were answered. The patient agreed with the plan and demonstrated an understanding of the instructions.   The patient was advised to call back or seek an in-person evaluation if the symptoms worsen or if the condition fails to improve as anticipated.  I provided 15 minutes of non-face-to-face time during this encounter.    Zigmund Gottron, NP  09/12/2019 12:40 PM         Zigmund Gottron, NP 09/12/19 1533

## 2019-12-09 IMAGING — DX DG SHOULDER 2+V*R*
3 series · 3 of 3 positions shown · non-contrast
Comparison: None.

CLINICAL DATA: Right shoulder pain and limited range of motion for
4 days.

EXAM:
RIGHT SHOULDER - 2+ VIEW

[shoulder grashey]
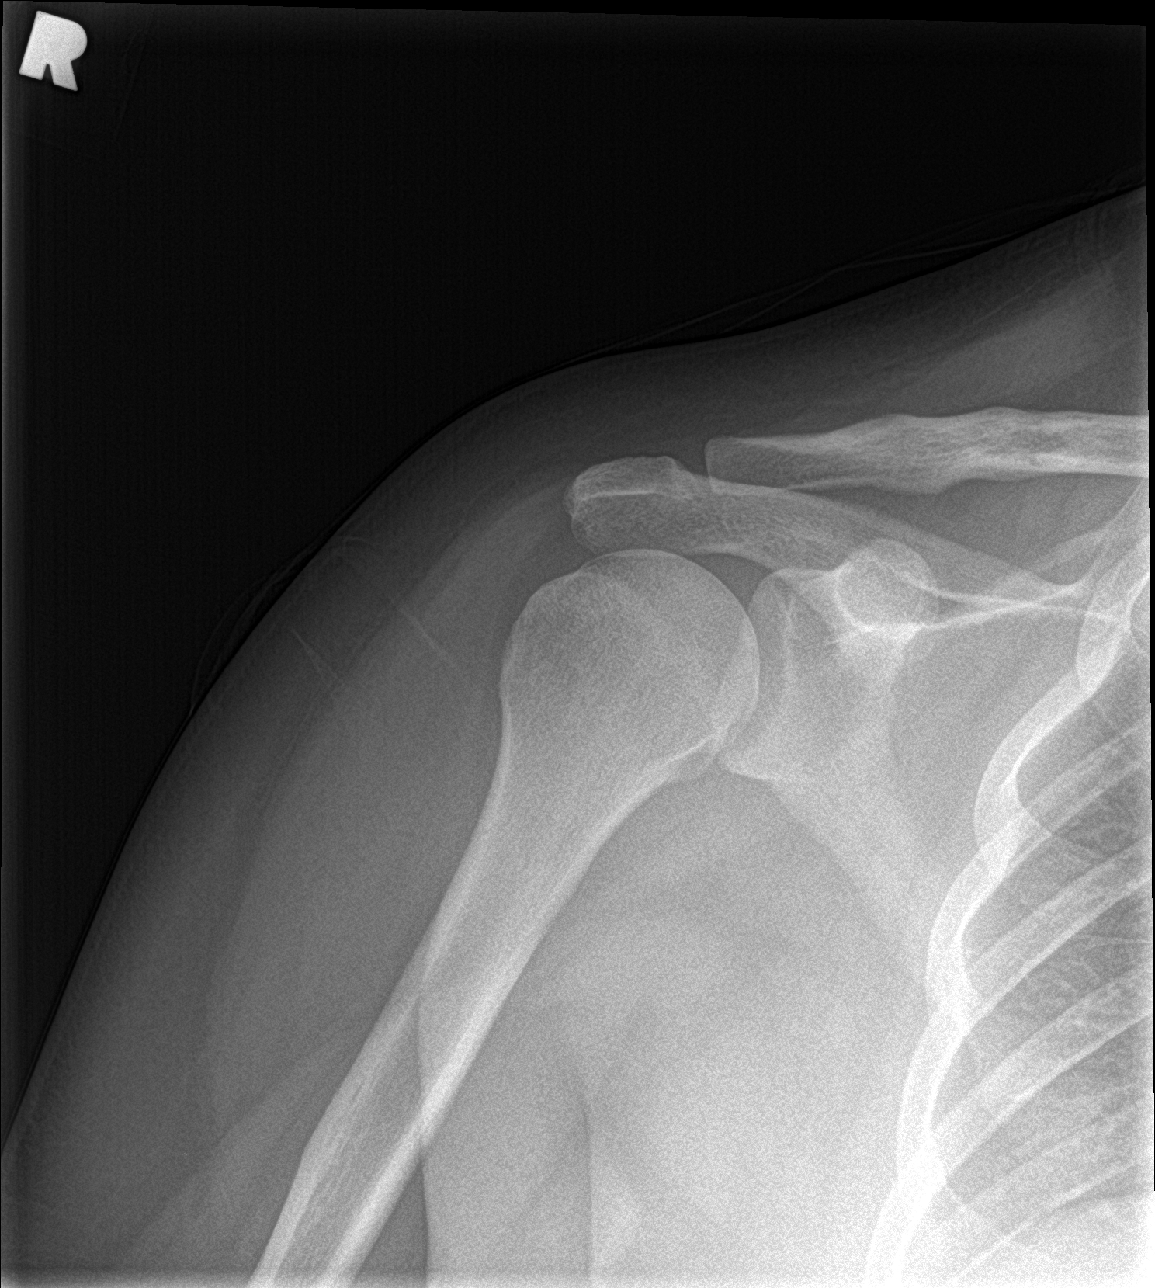

[shoulder y view]
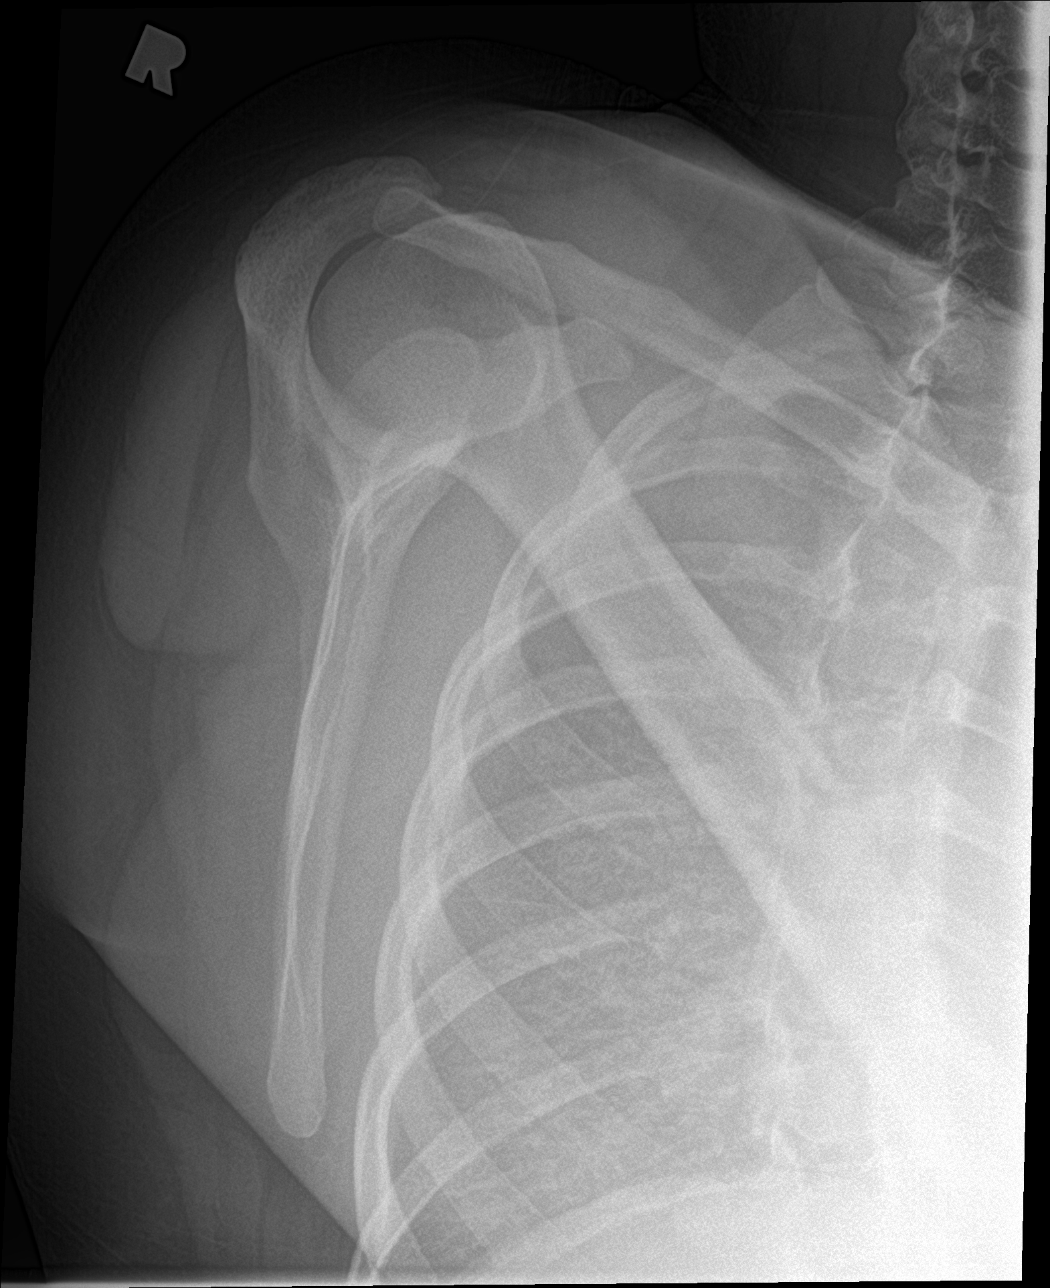

[shoulder axillary]
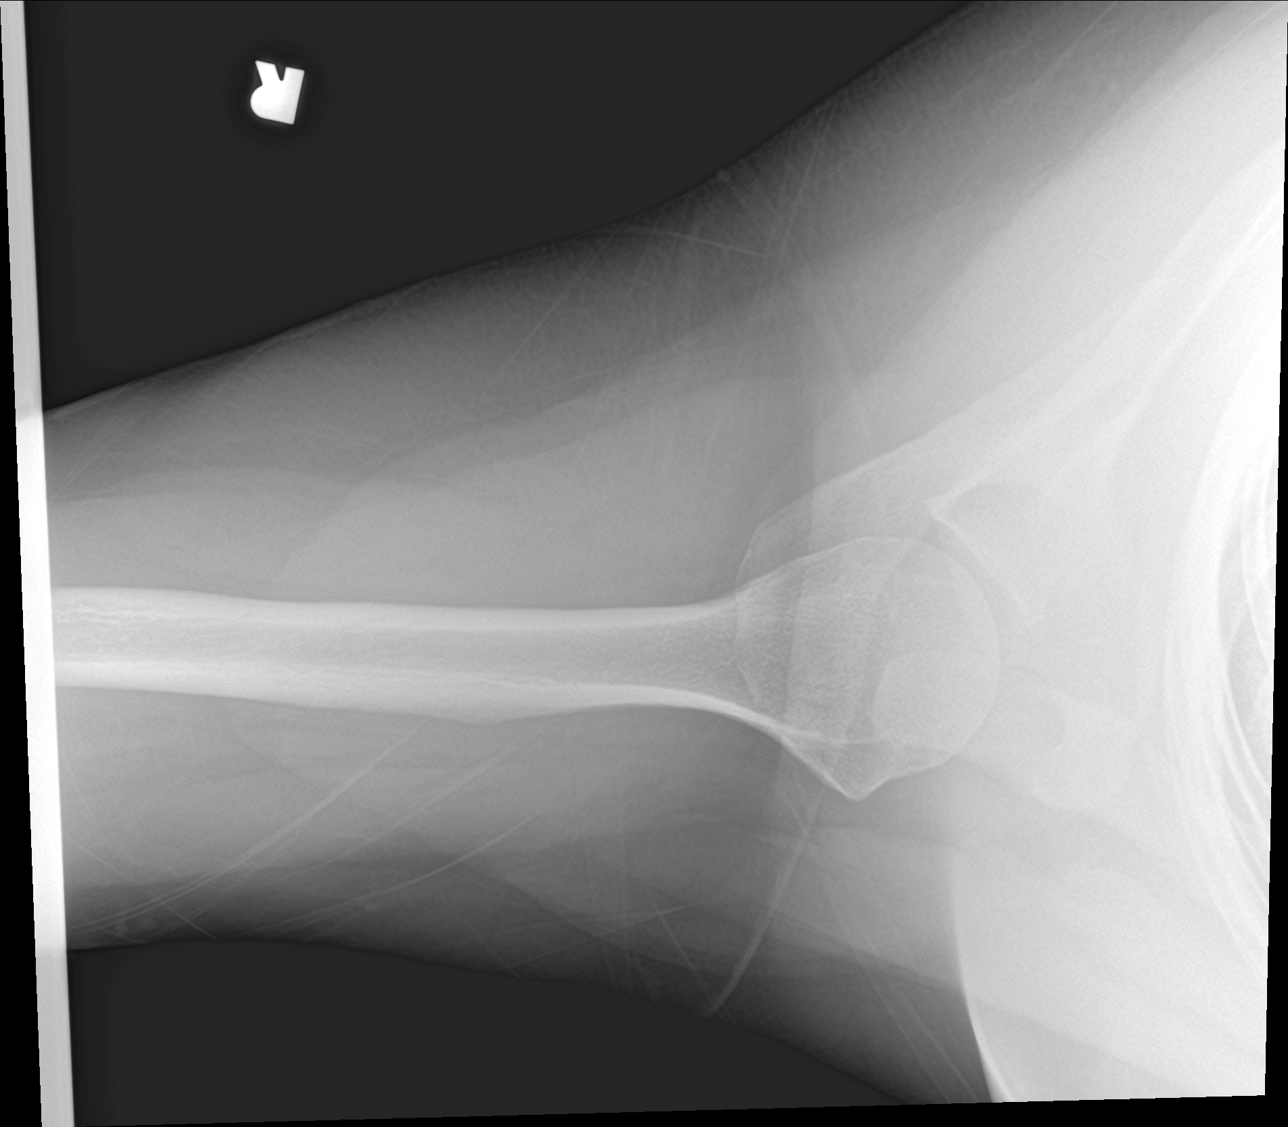

[3 of 3 positions shown; findings below may reference images not displayed]

FINDINGS: There is no evidence of fracture or dislocation. There is no
evidence of arthropathy or other focal bone abnormality. Soft
tissues are unremarkable.
IMPRESSION: Negative right shoulder radiographs.

## 2020-03-20 ENCOUNTER — Ambulatory Visit
Admission: EM | Admit: 2020-03-20 | Discharge: 2020-03-20 | Disposition: A | Discharge status: Home / Self Care | Payer: Medicaid Other | Attending: Emergency Medicine | Admitting: Emergency Medicine

## 2020-03-20 ENCOUNTER — Other Ambulatory Visit: Payer: Self-pay

## 2020-03-20 ENCOUNTER — Encounter: Payer: Self-pay | Admitting: Emergency Medicine

## 2020-03-20 DIAGNOSIS — Z760 Encounter for issue of repeat prescription: Secondary | ICD-10-CM | POA: Diagnosis not present

## 2020-03-20 DIAGNOSIS — Z113 Encounter for screening for infections with a predominantly sexual mode of transmission: Secondary | ICD-10-CM | POA: Diagnosis present

## 2020-03-20 DIAGNOSIS — N898 Other specified noninflammatory disorders of vagina: Secondary | ICD-10-CM | POA: Diagnosis not present

## 2020-03-20 HISTORY — DX: Fibromyalgia: M79.7

## 2020-03-20 LAB — POCT URINALYSIS DIP (MANUAL ENTRY)
Bilirubin, UA: NEGATIVE
Glucose, UA: NEGATIVE mg/dL
Ketones, POC UA: NEGATIVE mg/dL
Leukocytes, UA: NEGATIVE
Nitrite, UA: NEGATIVE
Protein Ur, POC: NEGATIVE mg/dL
Spec Grav, UA: 1.025 (ref 1.010–1.025)
Urobilinogen, UA: 0.2 E.U./dL
pH, UA: 5.5 (ref 5.0–8.0)

## 2020-03-20 MED ORDER — TRIAMCINOLONE ACETONIDE 0.1 % EX CREA: 1.0000 "application " | TOPICAL_CREAM | Freq: Two times a day (BID) | CUTANEOUS | 0 refills | Status: DC

## 2020-03-20 MED ORDER — METRONIDAZOLE 500 MG PO TABS: 500.0000 mg | ORAL_TABLET | Freq: Two times a day (BID) | ORAL | 0 refills | Status: DC

## 2020-03-20 NOTE — Discharge Instructions (Addendum)
  Vaginal self-swab obtained.  We will follow up with you regarding abnormal results Described metronidazole 500 mg orally twice a day for 7 days/Do not take alcohol while taking this medication. Triamcinolone was refilled Take medications as prescribed and to completion If tests results are positive, please abstain from sexual activity until you and your partner(s) have been treated Follow up with PCP or [facility] if symptoms persists Return here or go to ER if you have any new or worsening symptoms fever, chills, nausea, vomiting, abdominal or pelvic pain, painful intercourse, vaginal discharge, vaginal bleeding, persistent symptoms despite treatment, etc..Marland Kitchen

## 2020-03-20 NOTE — ED Provider Notes (Signed)
[place]   [id number] 03/20/20 Arrival Time: 1950   CC: STD screening   SUBJECTIVE:  Barbara Koch is a 34 y.o. female who presents to the urgent care with a complaint of vaginal itching, burning and foul smell for the past 2 to 3 days..  She denies a precipitating event, recent sexual encounter or recent antibiotic use.  Patient is sexually active with 1 female partner.  Denies vaginal discharge.  Has not tried any OTC medication.  Nothing made her symptoms worse.  Report similar symptoms in the past and was treated for BV.  She denies fever, chills, nausea, vomiting, abdominal or pelvic pain, urinary symptoms, vaginal bleeding, dyspareunia, vaginal rashes or lesions.   Patient want a refill on triamcinolone cream.   No LMP recorded. Patient has had an implant. Current birth control method: Compliant with BC:  ROS: As per HPI.  All other pertinent ROS negative.     Past Medical History:  Diagnosis Date  . ADHD   . Anxiety   . Depression   . Fibromyalgia   . Migraines   . PTSD (post-traumatic stress disorder)   . 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 Rheumatoid arthritis [facility])    Past Surgical History:  Procedure Laterality Date  . dislocated shoulder     right   Allergies  Allergen Reactions  . Celebrex [Celecoxib] Hives    Can take other NSAIDs  . Sulfa Antibiotics     Hives    No current facility-administered medications on file prior to encounter.   Current Outpatient Medications on File Prior to Encounter  Medication Sig Dispense Refill  . clonazePAM (KLONOPIN) 0.5 MG tablet Take 1 tablet (0.5 mg total) by mouth 2 (two) times daily as needed for anxiety. 30 tablet 0  . DULoxetine (CYMBALTA) 30 MG capsule Take 30 mg by mouth daily.    . DULoxetine (CYMBALTA) 60 MG capsule Take 60 mg by mouth daily.    Marland Kitchen etonogestrel (NEXPLANON) 68 MG IMPL implant 1 each by Subdermal route once. 05/2013    . gabapentin (NEURONTIN) 300 MG capsule TAKE 1 CAPSULE BY MOUTH  TWICE DAILY FOR ANXIETY    . ibuprofen (ADVIL,MOTRIN) 800 MG tablet Take 1 tablet (800 mg total) by mouth 3 (three) times daily. 21 tablet 0  . cloNIDine (CATAPRES) 0.1 MG tablet TAKE 1 TABLET BY MOUTH AT BEDTIME FOR SLEEP    . cyclobenzaprine (FLEXERIL) 5 MG tablet Take 1 tablet (5 mg total) by mouth at bedtime. 15 tablet 0  . dexamethasone (DECADRON) 4 MG tablet Take 1 tablet (4 mg total) by mouth 2 (two) times daily with a meal. 8 tablet 0  . Diclofenac Potassium,Migraine, 50 MG PACK Take 50 mg by mouth every 12 (twelve) hours as needed. 1 each 0  . HYDROcodone-acetaminophen (NORCO/VICODIN) 5-325 MG tablet Take one tab po q 4 hrs prn pain 12 tablet 0  . lisdexamfetamine (VYVANSE) 40 MG capsule Take 40 mg by mouth every morning.    . predniSONE (STERAPRED UNI-PAK 21 TAB) 10 MG (21) TBPK tablet Take by mouth daily. Per box instruction 21 tablet 0  . REXULTI 1 MG TABS TAKE 1 TABLET BY MOUTH AT BEDTIME FOR DEPRESSION      Social History   Socioeconomic History  . Marital status: Single    Spouse name: Not on file  . Number of children: Not on file  . Years of education: Not on file  . Highest education level: Not on file  Occupational History  .  Not on file  Tobacco Use  . Smoking status: Current Every Day Smoker    Packs/day: 1.00    Types: Cigarettes  . Smokeless tobacco: Never Used  Vaping Use  . Vaping Use: Never used  Substance and Sexual Activity  . Alcohol use: Yes    Comment: occas  . Drug use: Yes    Types: Marijuana    Comment: occ  . Sexual activity: Not Currently  Other Topics Concern  . Not on file  Social History Narrative  . Not on file   Social Determinants of Health   Financial Resource Strain:   . Difficulty of Paying Living Expenses:   Food Insecurity:   . Worried About Programme researcher, broadcasting/film/video in the Last Year:   . Barista in the Last Year:   Transportation Needs:   . Freight forwarder (Medical):   Marland Kitchen Lack of Transportation (Non-Medical):     Physical Activity:   . Days of Exercise per Week:   . Minutes of Exercise per Session:   Stress:   . Feeling of Stress :   Social Connections:   . Frequency of Communication with Friends and Family:   . Frequency of Social Gatherings with Friends and Family:   . Attends Religious Services:   . Active Member of Clubs or Organizations:   . Attends Banker Meetings:   Marland Kitchen Marital Status:   Intimate Partner Violence:   . Fear of Current or Ex-Partner:   . Emotionally Abused:   Marland Kitchen Physically Abused:   . Sexually Abused:    Family History  Problem Relation Age of Onset  . Depression Mother   . Cancer Father   . Cancer Other   . Diabetes Other     OBJECTIVE:  Vitals:   03/20/20 1422 03/20/20 1426  BP:  (!) 144/93  Pulse:  95  Resp:  18  Temp:  99.2 F (37.3 C)  TempSrc:  Oral  SpO2:  96%  Weight: 170 lb (77.1 kg)   Height: 5' (1.524 m)      General appearance: Alert, NAD, appears stated age Head: NCAT Throat: lips, mucosa, and tongue normal; teeth and gums normal Lungs: CTA bilaterally without adventitious breath sounds Heart: regular rate and rhythm.  Radial pulses 2+ symmetrical bilaterally Back: no CVA tenderness Abdomen: soft, non-tender; bowel sounds normal; no masses or organomegaly; no guarding or rebound tenderness GU:  Vaginal self swab obtained Skin: warm and dry Psychological:  Alert and cooperative. Normal mood and affect.  LABS:  Results for orders placed or performed during the hospital encounter of 02/09/19  Group A Strep by PCR   Specimen: Throat; Sterile Swab  Result Value Ref Range   Group A Strep by PCR NOT DETECTED NOT DETECTED    Labs Reviewed  URINE CULTURE  POCT URINALYSIS DIP (MANUAL ENTRY)  CERVICOVAGINAL ANCILLARY ONLY    ASSESSMENT & PLAN:  1. Screening for STD (sexually transmitted disease)   2. Medication refill   3. Vaginal itching    Patient is stable at discharge.  Symptom and past medical history was  reviewed.  We will  treat patient for possible BV.  Will await STD screening results.  Meds ordered this encounter  Medications  . metroNIDAZOLE (FLAGYL) 500 MG tablet    Sig: Take 1 tablet (500 mg total) by mouth 2 (two) times daily.    Dispense:  14 tablet    Refill:  0  . triamcinolone cream (KENALOG) 0.1 %  Sig: Apply 1 application topically 2 (two) times daily.    Dispense:  30 g    Refill:  0    Pending: Labs Reviewed  URINE CULTURE  POCT URINALYSIS DIP (MANUAL ENTRY)  CERVICOVAGINAL ANCILLARY ONLY    Discharge instructions  Vaginal self-swab obtained.  We will follow up with you regarding abnormal results Described metronidazole 500 mg orally twice a day for 7 days/Do not take alcohol while taking this medication. Triamcinolone was refilled Take medications as prescribed and to completion If tests results are positive, please abstain from sexual activity until you and your partner(s) have been treated Follow up with PCP or [facility] if symptoms persists Return here or go to ER if you have any new or worsening symptoms fever, chills, nausea, vomiting, abdominal or pelvic pain, painful intercourse, vaginal discharge, vaginal bleeding, persistent symptoms despite treatment, etc...  Reviewed expectations re: course of current medical issues. Questions answered. Outlined signs and symptoms indicating need for more acute intervention. Patient verbalized understanding. After Visit Summary given.   Note: This document was prepared using Dragon voice recognition software and may include unintentional dictation errors.     Durward Parcel, FNP 03/20/20 1455

## 2020-03-20 NOTE — ED Triage Notes (Signed)
Vaginal itching, burning and foul smell x 2-3 days ago . Denies any vaginal d/c or burning with urine.

## 2020-03-21 LAB — CERVICOVAGINAL ANCILLARY ONLY
Bacterial Vaginitis (gardnerella): POSITIVE — AB
Candida Glabrata: NEGATIVE
Candida Vaginitis: NEGATIVE
Chlamydia: NEGATIVE
Comment: NEGATIVE
Comment: NEGATIVE
Comment: NEGATIVE
Comment: NEGATIVE
Comment: NEGATIVE
Comment: NORMAL
Neisseria Gonorrhea: NEGATIVE
Trichomonas: NEGATIVE

## 2020-03-21 LAB — URINE CULTURE: Culture: 5000 — AB

## 2020-04-06 ENCOUNTER — Ambulatory Visit
Admission: EM | Admit: 2020-04-06 | Discharge: 2020-04-06 | Disposition: A | Discharge status: Home / Self Care | Payer: Non-veteran care | Attending: Emergency Medicine | Admitting: Emergency Medicine

## 2020-04-06 ENCOUNTER — Other Ambulatory Visit: Payer: Self-pay

## 2020-04-06 ENCOUNTER — Encounter: Payer: Self-pay | Admitting: Emergency Medicine

## 2020-04-06 ENCOUNTER — Ambulatory Visit: Payer: Self-pay

## 2020-04-06 DIAGNOSIS — H9202 Otalgia, left ear: Secondary | ICD-10-CM

## 2020-04-06 DIAGNOSIS — H65191 Other acute nonsuppurative otitis media, right ear: Secondary | ICD-10-CM | POA: Diagnosis not present

## 2020-04-06 MED ORDER — FLUTICASONE PROPIONATE 50 MCG/ACT NA SUSP: 1.0000 | Freq: Every day | NASAL | 0 refills | Status: DC

## 2020-04-06 MED ORDER — CIPROFLOXACIN-DEXAMETHASONE 0.3-0.1 % OT SUSP: 4.0000 [drp] | Freq: Two times a day (BID) | OTIC | 0 refills | Status: DC

## 2020-04-06 NOTE — ED Provider Notes (Signed)
[name]   [id number] 04/06/20 Arrival Time: 1536  Chief Complaint  Patient presents with  . Otalgia     SUBJECTIVE: History from: patient.  Barbara Koch is a 34 y.o. female who presented to the urgent care with a complaint of left ear pain for the past 3 days.  Reports a Q-tip  went further in her ear.   Patient states the pain is constant and achy in character.  Has not tried any OTC medication.  Denies aggravating factors.  Denies similar symptoms in the past.  Denies fever, chills, fatigue, sinus pain, rhinorrhea, ear discharge, sore throat, SOB, wheezing, chest pain, nausea, changes in bowel or bladder habits.    ROS: As per HPI.  All other pertinent ROS negative.     Past Medical History:  Diagnosis Date  . ADHD   . Anxiety   . Depression   . Fibromyalgia   . Migraines   . PTSD (post-traumatic stress disorder)   . 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 Rheumatoid arthritis [facility])    Past Surgical History:  Procedure Laterality Date  . dislocated shoulder     right   Allergies  Allergen Reactions  . Celebrex [Celecoxib] Hives    Can take other NSAIDs  . Sulfa Antibiotics     Hives    No current facility-administered medications on file prior to encounter.   Current Outpatient Medications on File Prior to Encounter  Medication Sig Dispense Refill  . clonazePAM (KLONOPIN) 0.5 MG tablet Take 1 tablet (0.5 mg total) by mouth 2 (two) times daily as needed for anxiety. 30 tablet 0  . cloNIDine (CATAPRES) 0.1 MG tablet TAKE 1 TABLET BY MOUTH AT BEDTIME FOR SLEEP    . cyclobenzaprine (FLEXERIL) 5 MG tablet Take 1 tablet (5 mg total) by mouth at bedtime. 15 tablet 0  . dexamethasone (DECADRON) 4 MG tablet Take 1 tablet (4 mg total) by mouth 2 (two) times daily with a meal. 8 tablet 0  . Diclofenac Potassium,Migraine, 50 MG PACK Take 50 mg by mouth every 12 (twelve) hours as needed. 1 each 0  . DULoxetine (CYMBALTA) 30 MG capsule Take 30 mg by mouth daily.    .  DULoxetine (CYMBALTA) 60 MG capsule Take 60 mg by mouth daily.    Marland Kitchen etonogestrel (NEXPLANON) 68 MG IMPL implant 1 each by Subdermal route once. 05/2013    . gabapentin (NEURONTIN) 300 MG capsule TAKE 1 CAPSULE BY MOUTH TWICE DAILY FOR ANXIETY    . HYDROcodone-acetaminophen (NORCO/VICODIN) 5-325 MG tablet Take one tab po q 4 hrs prn pain 12 tablet 0  . ibuprofen (ADVIL,MOTRIN) 800 MG tablet Take 1 tablet (800 mg total) by mouth 3 (three) times daily. 21 tablet 0  . lisdexamfetamine (VYVANSE) 40 MG capsule Take 40 mg by mouth every morning.    . metroNIDAZOLE (FLAGYL) 500 MG tablet Take 1 tablet (500 mg total) by mouth 2 (two) times daily. 14 tablet 0  . predniSONE (STERAPRED UNI-PAK 21 TAB) 10 MG (21) TBPK tablet Take by mouth daily. Per box instruction 21 tablet 0  . REXULTI 1 MG TABS TAKE 1 TABLET BY MOUTH AT BEDTIME FOR DEPRESSION    . triamcinolone cream (KENALOG) 0.1 % Apply 1 application topically 2 (two) times daily. 30 g 0   Social History   Socioeconomic History  . Marital status: Single    Spouse name: Not on file  . Number of children: Not on file  . Years of education: Not on file  .  Highest education level: Not on file  Occupational History  . Not on file  Tobacco Use  . Smoking status: Current Every Day Smoker    Packs/day: 1.00    Types: Cigarettes  . Smokeless tobacco: Never Used  Vaping Use  . Vaping Use: Never used  Substance and Sexual Activity  . Alcohol use: Yes    Comment: occas  . Drug use: Yes    Types: Marijuana    Comment: occ  . Sexual activity: Not Currently  Other Topics Concern  . Not on file  Social History Narrative  . Not on file   Social Determinants of Health   Financial Resource Strain:   . Difficulty of Paying Living Expenses:   Food Insecurity:   . Worried About Programme researcher, broadcasting/film/video in the Last Year:   . Barista in the Last Year:   Transportation Needs:   . Freight forwarder (Medical):   Marland Kitchen Lack of Transportation  (Non-Medical):   Physical Activity:   . Days of Exercise per Week:   . Minutes of Exercise per Session:   Stress:   . Feeling of Stress :   Social Connections:   . Frequency of Communication with Friends and Family:   . Frequency of Social Gatherings with Friends and Family:   . Attends Religious Services:   . Active Member of Clubs or Organizations:   . Attends Banker Meetings:   Marland Kitchen Marital Status:   Intimate Partner Violence:   . Fear of Current or Ex-Partner:   . Emotionally Abused:   Marland Kitchen Physically Abused:   . Sexually Abused:    Family History  Problem Relation Age of Onset  . Depression Mother   . Cancer Father   . Cancer Other   . Diabetes Other     OBJECTIVE:  Vitals:   04/06/20 1554 04/06/20 1555  BP: 136/89   Pulse: 86   Resp: 17   Temp: 98.7 F (37.1 C)   TempSrc: Oral   SpO2: 97%   Weight:  175 lb (79.4 kg)  Height:  5' (1.524 m)     Physical Exam Vitals and nursing note reviewed.  Constitutional:      General: She is not in acute distress.    Appearance: Normal appearance. She is normal weight. She is not ill-appearing, toxic-appearing or diaphoretic.  HENT:     Head: Normocephalic.     Right Ear: Ear canal and external ear normal. A middle ear effusion is present. There is no impacted cerumen.     Left Ear: Ear canal and external ear normal. Swelling and tenderness present.  No middle ear effusion. There is no impacted cerumen. Tympanic membrane is not perforated.  Cardiovascular:     Rate and Rhythm: Normal rate and regular rhythm.     Pulses: Normal pulses.     Heart sounds: Normal heart sounds. No murmur heard.  No friction rub. No gallop.   Pulmonary:     Effort: Pulmonary effort is normal. No respiratory distress.     Breath sounds: Normal breath sounds. No stridor. No wheezing, rhonchi or rales.  Chest:     Chest wall: No tenderness.  Neurological:     Mental Status: She is alert.      Imaging: No results  found.   ASSESSMENT & PLAN:  1. Left ear pain   2. Acute middle ear effusion, right     Meds ordered this encounter  Medications  . fluticasone (  FLONASE) 50 MCG/ACT nasal spray    Sig: Place 1 spray into both nostrils daily for 14 days.    Dispense:  16 g    Refill:  0  . ciprofloxacin-dexamethasone (CIPRODEX) OTIC suspension    Sig: Place 4 drops into the left ear 2 (two) times daily.    Dispense:  7.5 mL    Refill:  0   Patient is stable at discharge.  Ciprodex represcribed due to inflammation and tenderness in the left ear.  Flonase will be prescribed for possible middle ear effusion on the right ear.   Discharge instructions Rest and drink plenty of fluids Prescribed ciprodex ear drops Prescribed Flonase Take medications as directed and to completion Continue to use OTC ibuprofen and/ or tylenol as needed for pain control Follow up with PCP if symptoms persists Return here or go to the ER if you have any new or worsening symptoms   Reviewed expectations re: course of current medical issues. Questions answered. Outlined signs and symptoms indicating need for more acute intervention. Patient verbalized understanding. After Visit Summary given.      Note: This document was prepared using Dragon voice recognition software and may include unintentional dictation errors.    Durward Parcel, FNP 04/06/20 1615

## 2020-04-06 NOTE — ED Triage Notes (Signed)
LT ear pain that started x 3days after a qtip went down in her ear too far.

## 2020-04-06 NOTE — Discharge Instructions (Signed)
Rest and drink plenty of fluids Prescribed ciprodex ear drops Prescribed Flonase Take medications as directed and to completion Continue to use OTC ibuprofen and/ or tylenol as needed for pain control Follow up with PCP if symptoms persists Return here or go to the ER if you have any new or worsening symptoms

## 2020-05-23 ENCOUNTER — Ambulatory Visit
Admission: RE | Admit: 2020-05-23 | Discharge: 2020-05-23 | Disposition: A | Discharge status: Home / Self Care | Payer: No Typology Code available for payment source | Source: Ambulatory Visit | Attending: Emergency Medicine | Admitting: Emergency Medicine

## 2020-05-23 ENCOUNTER — Other Ambulatory Visit: Payer: Self-pay

## 2020-05-23 VITALS — BP 133/90 | HR 95 | Temp 98.8°F | Resp 20

## 2020-05-23 DIAGNOSIS — Z113 Encounter for screening for infections with a predominantly sexual mode of transmission: Secondary | ICD-10-CM

## 2020-05-23 NOTE — ED Provider Notes (Signed)
[facility]   [id number] 05/23/20 Arrival Time: 1411   VQ:[id number] FOR STD  SUBJECTIVE:  Barbara Koch is a 34 y.o. female who presents requesting STI screening.  Currently asymptomatic.  Partner asymptomatic.  Last unprotected sexual encounter within few weeks.  Sexually active with 1 female partner.  Denies similar symptoms in the past.  Denies fever, chills, nausea, vomiting, abdominal or pelvic pain, urinary symptoms, vaginal itching, vaginal odor, vaginal bleeding, dyspareunia, vaginal rashes or lesions.    No LMP recorded (lmp unknown). Patient has had an implant.  ROS: As per HPI.  All other pertinent ROS negative.     Past Medical History:  Diagnosis Date  . ADHD   . Anxiety   . Depression   . Fibromyalgia   . Migraines   . PTSD (post-traumatic stress disorder)   . 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 Rheumatoid arthritis [facility])    Past Surgical History:  Procedure Laterality Date  . dislocated shoulder     right   Allergies  Allergen Reactions  . Celebrex [Celecoxib] Hives    Can take other NSAIDs  . Sulfa Antibiotics     Hives    No current facility-administered medications on file prior to encounter.   Current Outpatient Medications on File Prior to Encounter  Medication Sig Dispense Refill  . ciprofloxacin-dexamethasone (CIPRODEX) OTIC suspension Place 4 drops into the left ear 2 (two) times daily. 7.5 mL 0  . clonazePAM (KLONOPIN) 0.5 MG tablet Take 1 tablet (0.5 mg total) by mouth 2 (two) times daily as needed for anxiety. 30 tablet 0  . cloNIDine (CATAPRES) 0.1 MG tablet TAKE 1 TABLET BY MOUTH AT BEDTIME FOR SLEEP    . cyclobenzaprine (FLEXERIL) 5 MG tablet Take 1 tablet (5 mg total) by mouth at bedtime. 15 tablet 0  . dexamethasone (DECADRON) 4 MG tablet Take 1 tablet (4 mg total) by mouth 2 (two) times daily with a meal. 8 tablet 0  . Diclofenac Potassium,Migraine, 50 MG PACK Take 50 mg by mouth every 12 (twelve) hours as needed. 1 each 0  .  DULoxetine (CYMBALTA) 30 MG capsule Take 30 mg by mouth daily.    . DULoxetine (CYMBALTA) 60 MG capsule Take 60 mg by mouth daily.    Marland Kitchen etonogestrel (NEXPLANON) 68 MG IMPL implant 1 each by Subdermal route once. 05/2013    . fluticasone (FLONASE) 50 MCG/ACT nasal spray Place 1 spray into both nostrils daily for 14 days. 16 g 0  . gabapentin (NEURONTIN) 300 MG capsule TAKE 1 CAPSULE BY MOUTH TWICE DAILY FOR ANXIETY    . HYDROcodone-acetaminophen (NORCO/VICODIN) 5-325 MG tablet Take one tab po q 4 hrs prn pain 12 tablet 0  . ibuprofen (ADVIL,MOTRIN) 800 MG tablet Take 1 tablet (800 mg total) by mouth 3 (three) times daily. 21 tablet 0  . lisdexamfetamine (VYVANSE) 40 MG capsule Take 40 mg by mouth every morning.    . metroNIDAZOLE (FLAGYL) 500 MG tablet Take 1 tablet (500 mg total) by mouth 2 (two) times daily. 14 tablet 0  . predniSONE (STERAPRED UNI-PAK 21 TAB) 10 MG (21) TBPK tablet Take by mouth daily. Per box instruction 21 tablet 0  . REXULTI 1 MG TABS TAKE 1 TABLET BY MOUTH AT BEDTIME FOR DEPRESSION    . triamcinolone cream (KENALOG) 0.1 % Apply 1 application topically 2 (two) times daily. 30 g 0   Social History   Socioeconomic History  . Marital status: Single    Spouse name: Not on file  .  Number of children: Not on file  . Years of education: Not on file  . Highest education level: Not on file  Occupational History  . Not on file  Tobacco Use  . Smoking status: Current Every Day Smoker    Packs/day: 1.00    Types: Cigarettes  . Smokeless tobacco: Never Used  Vaping Use  . Vaping Use: Never used  Substance and Sexual Activity  . Alcohol use: Yes    Comment: occas  . Drug use: Yes    Types: Marijuana    Comment: occ  . Sexual activity: Not Currently  Other Topics Concern  . Not on file  Social History Narrative  . Not on file   Social Determinants of Health   Financial Resource Strain:   . Difficulty of Paying Living Expenses:   Food Insecurity:   . Worried About  Programme researcher, broadcasting/film/video in the Last Year:   . Barista in the Last Year:   Transportation Needs:   . Freight forwarder (Medical):   Marland Kitchen Lack of Transportation (Non-Medical):   Physical Activity:   . Days of Exercise per Week:   . Minutes of Exercise per Session:   Stress:   . Feeling of Stress :   Social Connections:   . Frequency of Communication with Friends and Family:   . Frequency of Social Gatherings with Friends and Family:   . Attends Religious Services:   . Active Member of Clubs or Organizations:   . Attends Banker Meetings:   Marland Kitchen Marital Status:   Intimate Partner Violence:   . Fear of Current or Ex-Partner:   . Emotionally Abused:   Marland Kitchen Physically Abused:   . Sexually Abused:    Family History  Problem Relation Age of Onset  . Depression Mother   . Cancer Father   . Cancer Other   . Diabetes Other     OBJECTIVE:  Vitals:   05/23/20 1431  BP: 133/90  Pulse: 95  Resp: 20  Temp: 98.8 F (37.1 C)  SpO2: 96%     General appearance: alert, NAD, appears stated age Head: NCAT Throat: lips, mucosa, and tongue normal; teeth and gums normal Lungs: CTA bilaterally without adventitious breath sounds Heart: regular rate and rhythm.  Radial pulses 2+ symmetrical bilaterally Back: no CVA tenderness Abdomen: soft, non-tender; bowel sounds normal; no masses or organomegaly; no guarding or rebound tenderness GU: deferred Skin: warm and dry Psychological:  Alert and cooperative. Normal mood and affect.  LABS:  Results for orders placed or performed during the hospital encounter of 03/20/20  Urine culture   Specimen: Urine, Clean Catch  Result Value Ref Range   Specimen Description      URINE, CLEAN CATCH Performed at [facility], [address], [place], [place] [address]    Special Requests NONE    Culture (A)     5,000 COLONIES/mL GROUP B STREP(S.AGALACTIAE)ISOLATED TESTING AGAINST S. AGALACTIAE NOT ROUTINELY PERFORMED DUE TO PREDICTABILITY  OF AMP/PEN/VAN SUSCEPTIBILITY. Performed at [facility], [address] [place], [place] [address]    Report Status 03/21/2020 FINAL   POCT urinalysis dipstick  Result Value Ref Range   Color, UA yellow yellow   Clarity, UA clear clear   Glucose, UA negative negative mg/dL   Bilirubin, UA negative negative   Ketones, POC UA negative negative mg/dL   Spec Grav, UA 5.188 4.166 - 1.025   Blood, UA trace-intact (A) negative   pH, UA  5.5 5.0 - 8.0   Protein Ur, POC negative negative mg/dL   Urobilinogen, UA 0.2 0.2 or 1.0 E.U./dL   Nitrite, UA Negative Negative   Leukocytes, UA Negative Negative  Cervicovaginal ancillary only  Result Value Ref Range   Neisseria Gonorrhea Negative    Chlamydia Negative    Trichomonas Negative    Bacterial Vaginitis (gardnerella) Positive (A)    Candida Vaginitis Negative    Candida Glabrata Negative    Comment Normal Reference Range Candida Species - Negative    Comment Normal Reference Range Candida Galbrata - Negative    Comment Normal Reference Range Trichomonas - Negative    Comment Normal Reference Ranger Chlamydia - Negative    Comment      Normal Reference Range Neisseria Gonorrhea - Negative   Comment      Normal Reference Range Bacterial Vaginosis - Negative    Labs Reviewed  RPR  HIV ANTIBODY (ROUTINE TESTING W REFLEX)  CERVICOVAGINAL ANCILLARY ONLY    ASSESSMENT & PLAN:  1. Screening for STD (sexually transmitted disease)     No orders of the defined types were placed in this encounter.   Pending: Labs Reviewed  RPR  HIV ANTIBODY (ROUTINE TESTING W REFLEX)  CERVICOVAGINAL ANCILLARY ONLY    Discharge instructions Vaginal self swab, HIV and RPR were obtained We will follow up with you regarding abnormal results of your test If tests are positive, please abstain from sexual activity until you and your partner(s) are treated Follow up with PCP or [facility] if symptoms persists Return here or go to ER if  you have any new or worsening symptoms    Reviewed expectations re: course of current medical issues. Questions answered. Outlined signs and symptoms indicating need for more acute intervention. Patient verbalized understanding. After Visit Summary given.    Note: This document was prepared using Dragon voice recognition software and may include unintentional dictation errors.    Durward Parcel, FNP 05/23/20 1516

## 2020-05-23 NOTE — ED Triage Notes (Signed)
Pt here for std screen after partner told her that he may have hep b. Pt wants full screen for other stds, no symptoms

## 2020-05-23 NOTE — Discharge Instructions (Signed)
Vaginal self swab, HIV and RPR were obtained We will follow up with you regarding abnormal results of your test If tests are positive, please abstain from sexual activity until you and your partner(s) are treated Follow up with PCP or [facility] if symptoms persists Return here or go to ER if you have any new or worsening symptoms

## 2020-05-24 LAB — CERVICOVAGINAL ANCILLARY ONLY
Bacterial Vaginitis (gardnerella): NEGATIVE
Candida Glabrata: NEGATIVE
Candida Vaginitis: NEGATIVE
Chlamydia: NEGATIVE
Comment: NEGATIVE
Comment: NEGATIVE
Comment: NEGATIVE
Comment: NEGATIVE
Comment: NEGATIVE
Comment: NORMAL
Neisseria Gonorrhea: NEGATIVE
Trichomonas: NEGATIVE

## 2020-05-24 LAB — HIV ANTIBODY (ROUTINE TESTING W REFLEX): HIV Screen 4th Generation wRfx: NONREACTIVE

## 2020-05-24 LAB — RPR: RPR Ser Ql: NONREACTIVE

## 2020-07-12 ENCOUNTER — Other Ambulatory Visit: Payer: Self-pay

## 2020-07-12 ENCOUNTER — Ambulatory Visit
Admission: EM | Admit: 2020-07-12 | Discharge: 2020-07-12 | Disposition: A | Discharge status: Home / Self Care | Payer: Non-veteran care | Attending: Emergency Medicine | Admitting: Emergency Medicine

## 2020-07-12 DIAGNOSIS — L089 Local infection of the skin and subcutaneous tissue, unspecified: Secondary | ICD-10-CM

## 2020-07-12 DIAGNOSIS — Z20822 Contact with and (suspected) exposure to covid-19: Secondary | ICD-10-CM

## 2020-07-12 DIAGNOSIS — S71151A Open bite, right thigh, initial encounter: Secondary | ICD-10-CM | POA: Diagnosis not present

## 2020-07-12 DIAGNOSIS — W540XXA Bitten by dog, initial encounter: Secondary | ICD-10-CM

## 2020-07-12 DIAGNOSIS — S71151D Open bite, right thigh, subsequent encounter: Secondary | ICD-10-CM

## 2020-07-12 DIAGNOSIS — M545 Low back pain, unspecified: Secondary | ICD-10-CM

## 2020-07-12 DIAGNOSIS — Z5189 Encounter for other specified aftercare: Secondary | ICD-10-CM

## 2020-07-12 MED ORDER — CYCLOBENZAPRINE HCL 10 MG PO TABS: 10.0000 mg | ORAL_TABLET | Freq: Every day | ORAL | 0 refills | Status: DC

## 2020-07-12 MED ORDER — PREDNISONE 10 MG (21) PO TBPK: ORAL_TABLET | Freq: Every day | ORAL | 0 refills | Status: DC

## 2020-07-12 MED ORDER — MUPIROCIN 2 % EX OINT: 1.0000 "application " | TOPICAL_OINTMENT | Freq: Two times a day (BID) | CUTANEOUS | 1 refills | Status: DC

## 2020-07-12 MED ORDER — DEXAMETHASONE SODIUM PHOSPHATE 10 MG/ML IJ SOLN
10.0000 mg | Freq: Once | INTRAMUSCULAR | Status: AC
  Administered 2020-07-12: 10 mg via INTRAMUSCULAR

## 2020-07-12 NOTE — ED Provider Notes (Signed)
[facility]   [id number] 07/12/20 Arrival Time: 1552  CC: Back PAIN; wound check; covid test  SUBJECTIVE: History from: patient. Barbara Koch is a 34 y.o. female complains of Back pain x 1 day.  Denies a precipitating event or specific injury.  Localizes the pain to the left low back.  Describes the pain as constant and spasm in character.  Has tried OTC medications without relief.  Symptoms are made worse with movement.  Reports similar symptoms in the past.  Denies fever, chills, erythema, ecchymosis, effusion, weakness, numbness and tingling, saddle paresthesias, loss of bowel or bladder function.   Wound check: Wound to RT upper thigh.  Had dog bite 2 weeks ago.  Was not seen at that time.  Has tried cleaning with peroxide and alcohol.  Complains of associated warm, redness, and swelling.  Denies drainage.     COVID test: Requests covid test due to exposure.  Exposure 2-3 days ago.  Denies hx of covid.    ROS: As per HPI.  All other pertinent ROS negative.     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   Current Outpatient Medications on File Prior to Encounter  Medication Sig Dispense Refill   clonazePAM (KLONOPIN) 0.5 MG tablet Take 1 tablet (0.5 mg total) by mouth 2 (two) times daily as needed for anxiety. 30 tablet 0   cloNIDine (CATAPRES) 0.1 MG tablet TAKE 1 TABLET BY MOUTH AT BEDTIME FOR SLEEP     DULoxetine (CYMBALTA) 30 MG capsule Take 30 mg by mouth daily.     DULoxetine (CYMBALTA) 60 MG capsule Take  60 mg by mouth daily.     etonogestrel (NEXPLANON) 68 MG IMPL implant 1 each by Subdermal route once. 05/2013     gabapentin (NEURONTIN) 300 MG capsule TAKE 1 CAPSULE BY MOUTH TWICE DAILY FOR ANXIETY     fluticasone (FLONASE) 50 MCG/ACT nasal spray Place 1 spray into both nostrils daily for 14 days. 16 g 0   [DISCONTINUED] lisdexamfetamine (VYVANSE) 40 MG capsule Take 40 mg by mouth every morning.     Social History   Socioeconomic History   Marital status: Single    Spouse name: Not on file   Number of children: Not on file   Years of education: Not on file   Highest education level: Not on file  Occupational History   Not on file  Tobacco Use   Smoking status: Current Every Day Smoker    Packs/day: 1.00    Types: Cigarettes   Smokeless tobacco: Never Used  Vaping Use   Vaping Use: Never used  Substance and Sexual Activity   Alcohol use: Yes    Comment: occas   Drug use: Yes    Types: Marijuana    Comment: occ   Sexual activity: Not Currently  Other Topics Concern   Not on file  Social History Narrative   Not on file   Social Determinants of Health  Financial Resource Strain:    Difficulty of Paying Living Expenses: Not on file  Food Insecurity:    Worried About Programme researcher, broadcasting/film/video in the Last Year: Not on file   [employer] of Food in the Last Year: Not on file  Transportation Needs:    Lack of Transportation (Medical): Not on file   Lack of Transportation (Non-Medical): Not on file  Physical Activity:    Days of Exercise per Week: Not on file   Minutes of Exercise per Session: Not on file  Stress:    Feeling of Stress : Not on file  Social Connections:    Frequency of Communication with Friends and Family: Not on file   Frequency of Social Gatherings with Friends and Family: Not on file   Attends Religious Services: Not on file   Active Member of Clubs or Organizations: Not on file   Attends Banker Meetings: Not on  file   Marital Status: Not on file  Intimate Partner Violence:    Fear of Current or Ex-Partner: Not on file   Emotionally Abused: Not on file   Physically Abused: Not on file   Sexually Abused: Not on file   Family History  Problem Relation Age of Onset   Depression Mother    Cancer Father    Cancer Other    Diabetes Other     OBJECTIVE:  Vitals:   07/12/20 1701  BP: 130/89  Pulse: 86  Resp: 18  Temp: 98.3 F (36.8 C)  SpO2: 98%    General appearance: ALERT; in no acute distress.  Head: NCAT Lungs: Normal respiratory effort Musculoskeletal: Back Inspection: Skin warm, dry, clear and intact without obvious erythema, effusion, or ecchymosis.  Palpation: TTP over LT low back ROM: LROM about the back Strength: 5/5 shld abduction, 5/5 shld adduction, 5/5 elbow flexion, 5/5 elbow extension, 5/5 grip strength, 5/5 hip flexion, 5/5 hip extension Skin: warm and dry; 1 cm wound to posterior RT thigh, healing with mild surrounding erythema, no drainage Neurologic: Ambulates without difficulty Psychological: alert and cooperative; normal mood and affect   ASSESSMENT & PLAN:  1. Acute left-sided low back pain without sciatica   2. Exposure to COVID-19 virus   3. Visit for wound check   4. Dog bite of right thigh, subsequent encounter   5. Infected wound      Meds ordered this encounter  Medications   dexamethasone (DECADRON) injection 10 mg   predniSONE (STERAPRED UNI-PAK 21 TAB) 10 MG (21) TBPK tablet    Sig: Take by mouth daily. Take 6 tabs by mouth daily  for 2 days, then 5 tabs for 2 days, then 4 tabs for 2 days, then 3 tabs for 2 days, 2 tabs for 2 days, then 1 tab by mouth daily for 2 days    Dispense:  42 tablet    Refill:  0    Order Specific Question:   Supervising Provider    Answer:   Eustace Moore [[id number]]   cyclobenzaprine (FLEXERIL) 10 MG tablet    Sig: Take 1 tablet (10 mg total) by mouth at bedtime.    Dispense:  15 tablet    Refill:   0    Order Specific Question:   Supervising Provider    Answer:   Eustace Moore [[id number]]   mupirocin ointment (BACTROBAN) 2 %    Sig: Apply 1 application topically 2 (two) times daily.    Dispense:  30 g  Refill:  1    Order Specific Question:   Supervising Provider    Answer:   Eustace Moore [[id number]]   Back pain: Steroid shot given in office Continue conservative management of rest, ice, and gentle stretches Take naproxen as needed for pain relief (may cause abdominal discomfort, ulcers, and GI bleeds avoid taking with other NSAIDs) Take cyclobenzaprine at nighttime for symptomatic relief. Avoid driving or operating heavy machinery while using medication. Follow up with PCP if symptoms persist Return or go to the ER if you have any new or worsening symptoms (fever, chills, chest pain, abdominal pain, changes in bowel or bladder habits, pain radiating into lower legs, etc...)   Wound: Prescribed Bactroban ointment Continue to alternate ibuprofen and tylenol as needed for pain and fever Follow up with PCP if symptoms persists Return or go to the ED if you have any new or worsening symptoms such as increased pain, redness, swelling, discharge, high fever, night sweats, abdominal pain, etc...   COVID test: They will contact you regarding abnormal results in 3 days In the meantime remain in quarantine  Reviewed expectations re: course of current medical issues. Questions answered. Outlined signs and symptoms indicating need for more acute intervention. Patient verbalized understanding. After Visit Summary given.    Rennis Harding, PA-C 07/12/20 1736

## 2020-07-12 NOTE — Discharge Instructions (Signed)
Back pain: Steroid shot given in office Continue conservative management of rest, ice, and gentle stretches Take naproxen as needed for pain relief (may cause abdominal discomfort, ulcers, and GI bleeds avoid taking with other NSAIDs) Take cyclobenzaprine at nighttime for symptomatic relief. Avoid driving or operating heavy machinery while using medication. Follow up with PCP if symptoms persist Return or go to the ER if you have any new or worsening symptoms (fever, chills, chest pain, abdominal pain, changes in bowel or bladder habits, pain radiating into lower legs, etc...)   Wound: Prescribed Bactroban ointment Continue to alternate ibuprofen and tylenol as needed for pain and fever Follow up with PCP if symptoms persists Return or go to the ED if you have any new or worsening symptoms such as increased pain, redness, swelling, discharge, high fever, night sweats, abdominal pain, etc...   COVID test: They will contact you regarding abnormal results in 3 days In the meantime remain in quarantine

## 2020-07-12 NOTE — ED Triage Notes (Signed)
Pt presents with concerns for infection from dog bite two weeks ago on the back of her left leg. Pt was not seen for dog bite when it occurred unsure of vaccine status on dog.   Pt also complains of muscle spasms in her lower back specifically left side. Denies injury. Pain has been present since last night.

## 2020-07-14 LAB — NOVEL CORONAVIRUS, NAA: SARS-CoV-2, NAA: NOT DETECTED

## 2020-07-14 LAB — SARS-COV-2, NAA 2 DAY TAT

## 2021-01-03 ENCOUNTER — Ambulatory Visit
Admission: EM | Admit: 2021-01-03 | Discharge: 2021-01-03 | Disposition: A | Discharge status: Home / Self Care | Payer: Medicaid Other | Attending: Internal Medicine | Admitting: Internal Medicine

## 2021-01-03 ENCOUNTER — Encounter: Payer: Self-pay | Admitting: Internal Medicine

## 2021-01-03 ENCOUNTER — Other Ambulatory Visit: Payer: Self-pay

## 2021-01-03 DIAGNOSIS — R300 Dysuria: Secondary | ICD-10-CM | POA: Diagnosis not present

## 2021-01-03 DIAGNOSIS — N760 Acute vaginitis: Secondary | ICD-10-CM | POA: Insufficient documentation

## 2021-01-03 LAB — POCT URINALYSIS DIP (MANUAL ENTRY)
Bilirubin, UA: NEGATIVE
Glucose, UA: NEGATIVE mg/dL
Ketones, POC UA: NEGATIVE mg/dL
Leukocytes, UA: NEGATIVE
Nitrite, UA: NEGATIVE
Protein Ur, POC: NEGATIVE mg/dL
Spec Grav, UA: <=1.005 — AB (ref 1.010–1.025)
Urobilinogen, UA: 0.2 E.U./dL
pH, UA: 6 (ref 5.0–8.0)

## 2021-01-03 MED ORDER — NITROFURANTOIN MONOHYD MACRO 100 MG PO CAPS: 100.0000 mg | ORAL_CAPSULE | Freq: Two times a day (BID) | ORAL | 0 refills | Status: DC

## 2021-01-03 NOTE — ED Provider Notes (Signed)
[facility]    CSN: [id number] Arrival date & time: 01/03/21  1152      History   Chief Complaint Chief Complaint  Patient presents with  . Generalized Body Aches    HPI Barbara Koch is a 35 y.o. female who presents with onset of discomfort when urinating and has felt some vaginal irritation for the past 2-3 days. Has vaginal irritation even when not voiding. Denies antibiotics. She does not have a sexual partner but had sex without condom use with a female one month ago. She denies frequency.     Past Medical History:  Diagnosis Date  . ADHD   . Anxiety   . Depression   . Fibromyalgia   . Migraines   . PTSD (post-traumatic stress disorder)   . 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 Rheumatoid arthritis (HCC)     There are no problems to display for this patient.   Past Surgical History:  Procedure Laterality Date  . dislocated shoulder     right    OB History    Gravida  3   Para  1   Term  1   Preterm      AB  2   Living        SAB      IAB  2   Ectopic      Multiple      Live Births               Home Medications    Prior to Admission medications   Medication Sig Start Date End Date Taking? Authorizing Provider  nitrofurantoin, macrocrystal-monohydrate, (MACROBID) 100 MG capsule Take 1 capsule (100 mg total) by mouth 2 (two) times daily. 01/03/21  Yes Rodriguez-Southworth, Nettie Elm, PA-C  clonazePAM (KLONOPIN) 0.5 MG tablet Take 1 tablet (0.5 mg total) by mouth 2 (two) times daily as needed for anxiety. 08/24/14   Nelva Nay, MD  cloNIDine (CATAPRES) 0.1 MG tablet TAKE 1 TABLET BY MOUTH AT BEDTIME FOR SLEEP 01/25/19   [provider]  cyclobenzaprine (FLEXERIL) 10 MG tablet Take 1 tablet (10 mg total) by mouth at bedtime. 07/12/20   Wurst, [place], PA-C  DULoxetine (CYMBALTA) 30 MG capsule Take 30 mg by mouth daily.    [provider]  DULoxetine (CYMBALTA) 60 MG capsule Take 60 mg by mouth daily.    [provider]  etonogestrel (NEXPLANON) 68 MG IMPL implant 1 each by Subdermal route once. 05/2013    [provider]  fluticasone (FLONASE) 50 MCG/ACT nasal spray Place 1 spray into both nostrils daily for 14 days. 04/06/20 04/20/20  Avegno, Zachery Dakins, FNP  gabapentin (NEURONTIN) 300 MG capsule TAKE 1 CAPSULE BY MOUTH TWICE DAILY FOR ANXIETY 01/25/19   [provider]  mupirocin ointment (BACTROBAN) 2 % Apply 1 application topically 2 (two) times daily. 07/12/20   Wurst, [place], PA-C  predniSONE (STERAPRED UNI-PAK 21 TAB) 10 MG (21) TBPK tablet Take by mouth daily. Take 6 tabs by mouth daily  for 2 days, then 5 tabs for 2 days, then 4 tabs for 2 days, then 3 tabs for 2 days, 2 tabs for 2 days, then 1 tab by mouth daily for 2 days 07/12/20   Alvino Chapel, [place], PA-C  lisdexamfetamine (VYVANSE) 40 MG capsule Take 40 mg by mouth every morning.  07/12/20  [provider]    Family History Family History  Problem Relation Age of Onset  . Depression Mother   . Cancer Father   .  Cancer Other   . Diabetes Other     Social History Social History   Tobacco Use  . Smoking status: Current Every Day Smoker    Packs/day: 1.00    Types: Cigarettes  . Smokeless tobacco: Never Used  Vaping Use  . Vaping Use: Never used  Substance Use Topics  . Alcohol use: Yes    Comment: occas  . Drug use: Yes    Types: Marijuana    Comment: occ     Allergies   Celebrex [celecoxib] and Sulfa antibiotics   Review of Systems Review of Systems  Constitutional: Negative for fever.  Gastrointestinal: Negative for abdominal pain.  Genitourinary: Positive for difficulty urinating. Negative for dysuria, frequency, hematuria, pelvic pain and vaginal discharge.  Skin: Negative for rash and wound.     Physical Exam Triage Vital Signs ED Triage Vitals  Enc Vitals Group     BP 01/03/21 1249 136/84     Pulse Rate 01/03/21 1249 89     Resp 01/03/21 1249 18     Temp 01/03/21 1249 98.1  F (36.7 C)     Temp Source 01/03/21 1249 Oral     SpO2 01/03/21 1249 97 %     Weight --      Height --      Head Circumference --      Peak Flow --      Pain Score 01/03/21 1302 5     Pain Loc --      Pain Edu? --      Excl. in GC? --    No data found.  Updated Vital Signs BP 136/84   Pulse 89   Temp 98.1 F (36.7 C) (Oral)   Resp 18   SpO2 97%   Visual Acuity Right Eye Distance:   Left Eye Distance:   Bilateral Distance:    Right Eye Near:   Left Eye Near:    Bilateral Near:     Physical Exam Physical Exam Vitals and nursing note reviewed.  Constitutional:      General: She is not in acute distress.    Appearance: She is not toxic-appearing.  HENT:     Head: Normocephalic.     Right Ear: External ear normal.     Left Ear: External ear normal.  Eyes:     General: No scleral icterus.    Conjunctiva/sclera: Conjunctivae normal.  Pulmonary:     Effort: Pulmonary effort is normal.  Abdominal:     General: Bowel sounds are normal.     Palpations: Abdomen is soft. There is no mass.     Tenderness: There is no guarding or rebound.     Comments: - CVA tenderness   Musculoskeletal:        General: Normal range of motion.     Cervical back: Neck supple.   Skin:    General: Skin is warm and dry.     Findings: No rash.  Neurological:     Mental Status: She is alert and oriented to person, place, and time.     Gait: Gait normal.  Psychiatric:        Mood and Affect: Mood normal.        Behavior: Behavior normal.        Thought Content: Thought content normal.        Judgment: Judgment normal.  GU- declined    UC Treatments / Results  Labs (all labs ordered are listed, but only abnormal results are displayed) Labs  Reviewed  POCT URINALYSIS DIP (MANUAL ENTRY) - Abnormal; Notable for the following components:      Result Value   Spec Grav, UA <=1.005 (*)    Blood, UA trace-intact (*)    All other components within normal limits  URINE CULTURE   CERVICOVAGINAL ANCILLARY ONLY    EKG   Radiology No results found.  Procedures Procedures (including critical care time)  Medications Ordered in UC Medications - No data to display  Initial Impression / Assessment and Plan / UC Course  I have reviewed the triage vital signs and the nursing notes. Pertinent labs  results that were available during my care of the patient were reviewed by me and considered in my medical decision making (see chart for details). She was placed on Macrobid as noted. We will inform her about her vaginal swab if positive.  The urine was sent out for a culture Final Clinical Impressions(s) / UC Diagnoses   Final diagnoses:  Vaginitis and vulvovaginitis  Dysuria     Discharge Instructions     We will call you if the vaginal test results are positive or the urine culture shows anything positive.     ED Prescriptions    Medication Sig Dispense Auth. Provider   nitrofurantoin, macrocrystal-monohydrate, (MACROBID) 100 MG capsule Take 1 capsule (100 mg total) by mouth 2 (two) times daily. 10 capsule Rodriguez-Southworth, Nettie Elm, PA-C     I have reviewed the PDMP during this encounter.   Garey Ham, Cordelia Poche 01/03/21 2101

## 2021-01-03 NOTE — ED Triage Notes (Signed)
Discomfort when urinating and irritation to vaginal area for past 2-3 days.  All over body aches for years, hx of fibromyalgia and ra.  Takes Cymbalta and gabapentin but with the weather is having increased pain.

## 2021-01-03 NOTE — Discharge Instructions (Signed)
We will call you if the vaginal test results are positive or the urine culture shows anything positive.

## 2021-01-04 ENCOUNTER — Telehealth (HOSPITAL_COMMUNITY): Payer: Self-pay | Admitting: Emergency Medicine

## 2021-01-04 LAB — CERVICOVAGINAL ANCILLARY ONLY
Bacterial Vaginitis (gardnerella): POSITIVE — AB
Candida Glabrata: NEGATIVE
Candida Vaginitis: NEGATIVE
Chlamydia: NEGATIVE
Comment: NEGATIVE
Comment: NEGATIVE
Comment: NEGATIVE
Comment: NEGATIVE
Comment: NEGATIVE
Comment: NORMAL
Neisseria Gonorrhea: NEGATIVE
Trichomonas: NEGATIVE

## 2021-01-04 MED ORDER — METRONIDAZOLE 500 MG PO TABS: 500.0000 mg | ORAL_TABLET | Freq: Two times a day (BID) | ORAL | 0 refills | Status: DC

## 2021-01-05 LAB — URINE CULTURE

## 2021-01-07 ENCOUNTER — Other Ambulatory Visit: Payer: Self-pay

## 2021-01-07 ENCOUNTER — Ambulatory Visit
Admission: EM | Admit: 2021-01-07 | Discharge: 2021-01-07 | Disposition: A | Discharge status: Home / Self Care | Payer: Medicaid Other | Attending: Family Medicine | Admitting: Family Medicine

## 2021-01-07 ENCOUNTER — Encounter: Payer: Self-pay | Admitting: Emergency Medicine

## 2021-01-07 DIAGNOSIS — R8290 Unspecified abnormal findings in urine: Secondary | ICD-10-CM | POA: Insufficient documentation

## 2021-01-07 DIAGNOSIS — N771 Vaginitis, vulvitis and vulvovaginitis in diseases classified elsewhere: Secondary | ICD-10-CM

## 2021-01-07 NOTE — ED Triage Notes (Signed)
Patient here for recollect of urine for culture only

## 2021-01-08 LAB — URINE CULTURE

## 2021-06-18 ENCOUNTER — Emergency Department (HOSPITAL_COMMUNITY): Payer: No Typology Code available for payment source

## 2021-06-18 ENCOUNTER — Encounter (HOSPITAL_COMMUNITY): Payer: Self-pay

## 2021-06-18 ENCOUNTER — Other Ambulatory Visit: Payer: Self-pay

## 2021-06-18 ENCOUNTER — Emergency Department (HOSPITAL_COMMUNITY)
Admission: EM | Admit: 2021-06-18 | Discharge: 2021-06-18 | Disposition: A | Discharge status: Home / Self Care | Payer: No Typology Code available for payment source | Attending: Emergency Medicine | Admitting: Emergency Medicine

## 2021-06-18 DIAGNOSIS — F17210 Nicotine dependence, cigarettes, uncomplicated: Secondary | ICD-10-CM | POA: Diagnosis not present

## 2021-06-18 DIAGNOSIS — R519 Headache, unspecified: Secondary | ICD-10-CM | POA: Insufficient documentation

## 2021-06-18 DIAGNOSIS — W19XXXA Unspecified fall, initial encounter: Secondary | ICD-10-CM | POA: Insufficient documentation

## 2021-06-18 DIAGNOSIS — M542 Cervicalgia: Secondary | ICD-10-CM | POA: Insufficient documentation

## 2021-06-18 LAB — PREGNANCY, URINE: Preg Test, Ur: NEGATIVE

## 2021-06-18 MED ORDER — CYCLOBENZAPRINE HCL 10 MG PO TABS: 10.0000 mg | ORAL_TABLET | Freq: Three times a day (TID) | ORAL | 0 refills | Status: DC | PRN

## 2021-06-18 MED ORDER — HYDROCODONE-ACETAMINOPHEN 5-325 MG PO TABS: 1.0000 | ORAL_TABLET | Freq: Four times a day (QID) | ORAL | 0 refills | Status: DC | PRN

## 2021-06-18 NOTE — Discharge Instructions (Addendum)
Follow-up with your family doctor next week if any problems 
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
## 2021-06-18 NOTE — ED Provider Notes (Signed)
[facility] EMERGENCY DEPARTMENT Provider Note   CSN: [id number] Arrival date & time: 06/18/21  0809     History Chief Complaint  Patient presents with   Barbara Koch is a 35 y.o. female.  Patient fell a couple days ago and hit her head and neck and complains of head neck and upper back pain  The history is provided by the patient and medical records. No language interpreter was used.  Fall This is a new problem. The current episode started 2 days ago. The problem occurs rarely. The problem has been resolved. Pertinent negatives include no chest pain, no abdominal pain and no headaches. Nothing aggravates the symptoms. Nothing relieves the symptoms. She has tried nothing for the symptoms. The treatment provided no relief.      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   Past Surgical History:  Procedure Laterality Date   dislocated shoulder     right     OB History     Gravida  3   Para  1   Term  1   Preterm      AB  2   Living         SAB      IAB  2   Ectopic      Multiple      Live Births              Family History  Problem Relation Age of Onset   Depression Mother    Cancer Father    Cancer Other    Diabetes Other     Social History   Tobacco Use   Smoking status: Every Day    Packs/day: 1.00    Types: Cigarettes   Smokeless tobacco: Never  Vaping Use   Vaping Use: Never used  Substance Use Topics   Alcohol use: Not Currently    Comment: occas   Drug use: Yes    Types: Marijuana    Comment: occ    Home Medications Prior to Admission medications   Medication Sig Start Date End Date Taking? Authorizing Provider  cholecalciferol (VITAMIN D3) 25 MCG (1000 UNIT) tablet Take 1,000 Units by mouth  daily.   Yes [provider]  clonazePAM (KLONOPIN) 1 MG tablet Take 1 mg by mouth 2 (two) times daily as needed. 06/04/21  Yes [provider]  cloNIDine (CATAPRES) 0.1 MG tablet TAKE 1 TABLET BY MOUTH AT BEDTIME FOR SLEEP 01/25/19  Yes [provider]  cyclobenzaprine (FLEXERIL) 10 MG tablet Take 1 tablet (10 mg total) by mouth 3 (three) times daily as needed for muscle spasms. 06/18/21  Yes Bethann Berkshire, MD  DULoxetine (CYMBALTA) 60 MG capsule Take 120 mg by mouth daily.   Yes [provider]  etonogestrel (NEXPLANON) 68 MG IMPL implant 1 each by Subdermal route once. 05/2013   Yes [provider]  gabapentin (NEURONTIN) 300 MG capsule 600 mg 2 (two) times daily. 01/25/19  Yes [provider]  HYDROcodone-acetaminophen (NORCO/VICODIN) 5-325 MG tablet Take 1 tablet by mouth every 6 (six) hours as needed. 06/18/21  Yes Bethann Berkshire, MD  lisdexamfetamine (VYVANSE) 30 MG capsule Take 30 mg by mouth daily.  Yes [provider]  fluticasone (FLONASE) 50 MCG/ACT nasal spray Place 1 spray into both nostrils daily for 14 days. 04/06/20 04/20/20  Avegno, Zachery Dakins, FNP  metroNIDAZOLE (FLAGYL) 500 MG tablet Take 1 tablet (500 mg total) by mouth 2 (two) times daily. Patient not taking: Reported on 06/18/2021 01/04/21   Merrilee Jansky, MD  mupirocin ointment (BACTROBAN) 2 % Apply 1 application topically 2 (two) times daily. Patient not taking: Reported on 06/18/2021 07/12/20   Wurst, [place], PA-C  nitrofurantoin, macrocrystal-monohydrate, (MACROBID) 100 MG capsule Take 1 capsule (100 mg total) by mouth 2 (two) times daily. Patient not taking: Reported on 06/18/2021 01/03/21   Rodriguez-Southworth, Nettie Elm, PA-C  predniSONE (STERAPRED UNI-PAK 21 TAB) 10 MG (21) TBPK tablet Take by mouth daily. Take 6 tabs by mouth daily  for 2 days, then 5 tabs for 2 days, then 4 tabs for 2 days, then 3 tabs for 2 days, 2 tabs for 2 days, then 1 tab by mouth daily for 2  days Patient not taking: No sig reported 07/12/20   Wurst, [place], PA-C    Allergies    Celebrex [celecoxib] and Sulfa antibiotics  Review of Systems   Review of Systems  Constitutional:  Negative for appetite change and fatigue.  HENT:  Negative for congestion, ear discharge and sinus pressure.        Headache and neck ache  Eyes:  Negative for discharge.  Respiratory:  Negative for cough.   Cardiovascular:  Negative for chest pain.  Gastrointestinal:  Negative for abdominal pain and diarrhea.  Genitourinary:  Negative for frequency and hematuria.  Musculoskeletal:  Negative for back pain.  Skin:  Negative for rash.  Neurological:  Negative for seizures and headaches.  Psychiatric/Behavioral:  Negative for hallucinations.    Physical Exam Updated Vital Signs BP (!) 138/94 (BP Location: Right Arm)   Pulse 80   Temp 98.4 F (36.9 C) (Oral)   Resp 18   Ht 5' (1.524 m)   Wt 86.7 kg   SpO2 100%   BMI 37.32 kg/m   Physical Exam Vitals and nursing note reviewed.  Constitutional:      Appearance: She is well-developed.  HENT:     Head: Normocephalic.     Nose: Nose normal.  Eyes:     General: No scleral icterus.    Conjunctiva/sclera: Conjunctivae normal.  Neck:     Thyroid: No thyromegaly.     Comments: Tender posterior Cardiovascular:     Rate and Rhythm: Normal rate and regular rhythm.     Heart sounds: No murmur heard.   No friction rub. No gallop.  Pulmonary:     Breath sounds: No stridor. No wheezing or rales.  Chest:     Chest wall: No tenderness.  Abdominal:     General: There is no distension.     Tenderness: There is no abdominal tenderness. There is no rebound.  Musculoskeletal:        General: Normal range of motion.     Cervical back: Neck supple.  Lymphadenopathy:     Cervical: No cervical adenopathy.  Skin:    Findings: No erythema or rash.  Neurological:     Mental Status: She is alert and oriented to person, place, and time.     Motor: No  abnormal muscle tone.     Coordination: Coordination normal.  Psychiatric:        Behavior: Behavior normal.    ED Results / Procedures / Treatments   Labs (all  labs ordered are listed, but only abnormal results are displayed) Labs Reviewed  PREGNANCY, URINE    EKG None  Radiology DG Chest 2 View  Result Date: 06/18/2021 CLINICAL DATA:  pain EXAM: CHEST - 2 VIEW COMPARISON:  None. FINDINGS: Borderline enlargement of the cardiac silhouette. No consolidation. No visible pleural effusion or pneumothorax. No evidence of acute osseous abnormality. IMPRESSION: 1. No evidence of acute cardiopulmonary disease. 2. Borderline cardiomegaly. Electronically Signed   By: Feliberto Harts M.D.   On: 06/18/2021 11:58   DG Lumbar Spine Complete  Result Date: 06/18/2021 CLINICAL DATA:  Low back pain after fall. EXAM: LUMBAR SPINE - COMPLETE 4+ VIEW COMPARISON:  None. FINDINGS: There is no evidence of lumbar spine fracture. Alignment is normal. Intervertebral disc spaces are maintained. IMPRESSION: Negative. Electronically Signed   By: Lupita Raider M.D.   On: 06/18/2021 11:57   CT HEAD WO CONTRAST ( )  Result Date: 06/18/2021 CLINICAL DATA:  Head trauma, minor, normal mental status (Age 45-64y). Fall EXAM: CT HEAD WITHOUT CONTRAST TECHNIQUE: Contiguous axial images were obtained from the base of the skull through the vertex without intravenous contrast. COMPARISON:  None. FINDINGS: Brain: No acute intracranial abnormality. Specifically, no hemorrhage, hydrocephalus, mass lesion, acute infarction, or significant intracranial injury. Vascular: No hyperdense vessel or unexpected calcification. Skull: No acute calvarial abnormality. Sinuses/Orbits: No acute findings Other: None IMPRESSION: No acute intracranial abnormality. Electronically Signed   By: Charlett Nose M.D.   On: 06/18/2021 11:30   CT Cervical Spine Wo Contrast  Result Date: 06/18/2021 CLINICAL DATA:  Headache, left neck pain EXAM: CT  CERVICAL SPINE WITHOUT CONTRAST TECHNIQUE: Multidetector CT imaging of the cervical spine was performed without intravenous contrast. Multiplanar CT image reconstructions were also generated. COMPARISON:  None. FINDINGS: Alignment: Normal Skull base and vertebrae: No acute fracture. No primary bone lesion or focal pathologic process. Soft tissues and spinal canal: No prevertebral fluid or swelling. No visible canal hematoma. Disc levels:  Normal Upper chest: Negative Other: None IMPRESSION: Negative. Electronically Signed   By: Charlett Nose M.D.   On: 06/18/2021 11:33    Procedures Procedures   Medications Ordered in ED Medications - No data to display  ED Course  I have reviewed the triage vital signs and the nursing notes.  Pertinent labs & imaging results that were available during my care of the patient were reviewed by me and considered in my medical decision making (see chart for details).    MDM Rules/Calculators/A&P                           CT head neck unremarkable and x-rays negative.  Suspect musculoskeletal discomfort.  Patient given Flexeril and hydrocodone will follow-up as needed Final Clinical Impression(s) / ED Diagnoses Final diagnoses:  Fall, initial encounter    Rx / DC Orders ED Discharge Orders          Ordered    cyclobenzaprine (FLEXERIL) 10 MG tablet  3 times daily PRN        06/18/21 1303    HYDROcodone-acetaminophen (NORCO/VICODIN) 5-325 MG tablet  Every 6 hours PRN        06/18/21 1303             Bethann Berkshire, MD 06/20/21 [phone]

## 2021-09-13 ENCOUNTER — Encounter: Payer: Self-pay | Admitting: Emergency Medicine

## 2021-09-13 ENCOUNTER — Other Ambulatory Visit: Payer: Self-pay

## 2021-09-13 ENCOUNTER — Ambulatory Visit
Admission: EM | Admit: 2021-09-13 | Discharge: 2021-09-13 | Disposition: A | Discharge status: Home / Self Care | Payer: No Typology Code available for payment source | Attending: Family Medicine | Admitting: Family Medicine

## 2021-09-13 DIAGNOSIS — R590 Localized enlarged lymph nodes: Secondary | ICD-10-CM | POA: Diagnosis not present

## 2021-09-13 DIAGNOSIS — Z1152 Encounter for screening for COVID-19: Secondary | ICD-10-CM | POA: Diagnosis not present

## 2021-09-13 DIAGNOSIS — J029 Acute pharyngitis, unspecified: Secondary | ICD-10-CM | POA: Diagnosis present

## 2021-09-13 LAB — POCT RAPID STREP A (OFFICE): Rapid Strep A Screen: NEGATIVE

## 2021-09-13 MED ORDER — PREDNISONE 20 MG PO TABS: 40.0000 mg | ORAL_TABLET | Freq: Every day | ORAL | 0 refills | Status: DC

## 2021-09-13 MED ORDER — LIDOCAINE VISCOUS HCL 2 % MT SOLN: 5.0000 mL | OROMUCOSAL | 0 refills | Status: DC | PRN

## 2021-09-13 NOTE — ED Triage Notes (Signed)
Patient c/o lymph node swelling and fatigue x 3 days.  Patient endorses sore throat. Patient endorses bilateral ear pressure.   Patient endorses generalized aches.   Patient endorses progressively worsening symptoms.   History of Fibromyalgia and RA.

## 2021-09-14 LAB — COVID-19, FLU A+B NAA
Influenza A, NAA: NOT DETECTED
Influenza B, NAA: NOT DETECTED
SARS-CoV-2, NAA: NOT DETECTED

## 2021-09-16 LAB — CULTURE, GROUP A STREP (THRC)

## 2021-09-17 ENCOUNTER — Telehealth (HOSPITAL_COMMUNITY): Payer: Self-pay | Admitting: Emergency Medicine

## 2021-09-17 MED ORDER — AZITHROMYCIN 250 MG PO TABS: 250.0000 mg | ORAL_TABLET | Freq: Every day | ORAL | 0 refills | Status: DC

## 2021-09-17 MED ORDER — FLUCONAZOLE 150 MG PO TABS: 150.0000 mg | ORAL_TABLET | Freq: Once | ORAL | 0 refills | Status: AC

## 2021-09-17 NOTE — ED Provider Notes (Signed)
[facility]    CSN: [id number] Arrival date & time: 09/13/21  1807      History   Chief Complaint Chief Complaint  Patient presents with   Lymphadenopathy   Fatigue    HPI Barbara Koch is a 35 y.o. female.   Presenting today with 3 day history of lymphadenopathy and fatigue, sore throat, ear pressure, generalized body aches. Denies known fever, CP, SOB, abdominal pain, N/V/D. So far trying OTC pain relievers and cold medications with minimal relief. Hx of fibromyalgia and RA. No known sick contacts.    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   Past Surgical History:  Procedure Laterality Date   dislocated shoulder     right    OB History     Gravida  3   Para  1   Term  1   Preterm      AB  2   Living         SAB      IAB  2   Ectopic      Multiple      Live Births               Home Medications    Prior to Admission medications   Medication Sig Start Date End Date Taking? Authorizing Provider  clonazePAM (KLONOPIN) 1 MG tablet Take 1 mg by mouth 2 (two) times daily as needed. 06/04/21  Yes [provider]  cloNIDine (CATAPRES) 0.1 MG tablet TAKE 1 TABLET BY MOUTH AT BEDTIME FOR SLEEP 01/25/19  Yes [provider]  DULoxetine (CYMBALTA) 60 MG capsule Take 120 mg by mouth daily.   Yes [provider]  gabapentin (NEURONTIN) 300 MG capsule 600 mg 2 (two) times daily. 01/25/19  Yes [provider]  lidocaine (XYLOCAINE) 2 % solution Use as directed 5 mLs in the mouth or throat as needed for mouth pain. 09/13/21  Yes Particia Nearing, PA-C  lisdexamfetamine (VYVANSE) 30 MG capsule Take 30 mg by mouth daily.   Yes [provider]  predniSONE (DELTASONE) 20 MG tablet  Take 2 tablets (40 mg total) by mouth daily with breakfast. 09/13/21  Yes Particia Nearing, PA-C  Turmeric (QC TUMERIC COMPLEX PO) Take by mouth.   Yes [provider]  Zinc Citrate (ZINC EXTRA STRENGTH PO) Take by mouth.   Yes [provider]  azithromycin (ZITHROMAX) 250 MG tablet Take 1 tablet (250 mg total) by mouth daily. Take first 2 tablets together, then 1 every day until finished. 09/17/21   LampteyBritta Mccreedy, MD  cholecalciferol (VITAMIN D3) 25 MCG (1000 UNIT) tablet Take 1,000 Units by mouth daily.    [provider]  cyclobenzaprine (FLEXERIL) 10 MG tablet Take 1 tablet (10 mg total) by mouth 3 (three) times daily as needed for muscle spasms. 06/18/21   Bethann Berkshire, MD  etonogestrel (NEXPLANON) 68 MG IMPL implant 1 each by Subdermal route once. 05/2013    [provider]  fluconazole (DIFLUCAN) 150 MG tablet Take 1 tablet (150 mg total) by mouth once for 1 dose. Repeat in 3 days if symptoms persist 09/17/21 09/17/21  Merrilee Jansky, MD  fluticasone [facility]) 50 MCG/ACT nasal  spray Place 1 spray into both nostrils daily for 14 days. 04/06/20 04/20/20  Avegno, Zachery Dakins, FNP  HYDROcodone-acetaminophen (NORCO/VICODIN) 5-325 MG tablet Take 1 tablet by mouth every 6 (six) hours as needed. 06/18/21   Bethann Berkshire, MD  metroNIDAZOLE (FLAGYL) 500 MG tablet Take 1 tablet (500 mg total) by mouth 2 (two) times daily. Patient not taking: Reported on 06/18/2021 01/04/21   Merrilee Jansky, MD  mupirocin ointment (BACTROBAN) 2 % Apply 1 application topically 2 (two) times daily. Patient not taking: Reported on 06/18/2021 07/12/20   Wurst, [place], PA-C  nitrofurantoin, macrocrystal-monohydrate, (MACROBID) 100 MG capsule Take 1 capsule (100 mg total) by mouth 2 (two) times daily. Patient not taking: Reported on 06/18/2021 01/03/21   Rodriguez-Southworth, Nettie Elm, PA-C  predniSONE (STERAPRED UNI-PAK 21 TAB) 10 MG (21) TBPK tablet Take by mouth daily. Take 6 tabs by  mouth daily  for 2 days, then 5 tabs for 2 days, then 4 tabs for 2 days, then 3 tabs for 2 days, 2 tabs for 2 days, then 1 tab by mouth daily for 2 days Patient not taking: No sig reported 07/12/20   Alvino Chapel [place], PA-C    Family History Family History  Problem Relation Age of Onset   Depression Mother    Cancer Father    Cancer Other    Diabetes Other     Social History Social History   Tobacco Use   Smoking status: Every Day    Packs/day: 1.00    Types: Cigarettes   Smokeless tobacco: Never  Vaping Use   Vaping Use: Never used  Substance Use Topics   Alcohol use: Not Currently    Comment: occas   Drug use: Yes    Types: Marijuana    Comment: occ     Allergies   Celebrex [celecoxib] and Sulfa antibiotics   Review of Systems Review of Systems PER HPI   Physical Exam Triage Vital Signs ED Triage Vitals  Enc Vitals Group     BP 09/13/21 1934 130/90     Pulse Rate 09/13/21 1934 91     Resp 09/13/21 1934 14     Temp 09/13/21 1934 98.5 F (36.9 C)     Temp Source 09/13/21 1934 Oral     SpO2 09/13/21 1934 96 %     Weight --      Height --      Head Circumference --      Peak Flow --      Pain Score 09/13/21 1935 7     Pain Loc --      Pain Edu? --      Excl. in GC? --    No data found.  Updated Vital Signs BP 130/90 (BP Location: Right Arm)    Pulse 91    Temp 98.5 F (36.9 C) (Oral)    Resp 14    LMP  (LMP Unknown)    SpO2 96%   Visual Acuity Right Eye Distance:   Left Eye Distance:   Bilateral Distance:    Right Eye Near:   Left Eye Near:    Bilateral Near:     Physical Exam Vitals and nursing note reviewed.  Constitutional:      Appearance: Normal appearance.  HENT:     Head: Atraumatic.     Right Ear: Tympanic membrane and external ear normal.     Left Ear: Tympanic membrane and external ear normal.     Nose: Rhinorrhea present.     Mouth/Throat:  Mouth: Mucous membranes are moist.     Pharynx: Posterior oropharyngeal erythema  present.  Eyes:     Extraocular Movements: Extraocular movements intact.     Conjunctiva/sclera: Conjunctivae normal.  Cardiovascular:     Rate and Rhythm: Normal rate and regular rhythm.     Heart sounds: Normal heart sounds.  Pulmonary:     Effort: Pulmonary effort is normal.     Breath sounds: Normal breath sounds. No wheezing or rales.  Musculoskeletal:        General: Normal range of motion.     Cervical back: Normal range of motion and neck supple.  Skin:    General: Skin is warm and dry.  Neurological:     Mental Status: She is alert and oriented to person, place, and time.  Psychiatric:        Mood and Affect: Mood normal.        Thought Content: Thought content normal.     UC Treatments / Results  Labs (all labs ordered are listed, but only abnormal results are displayed) Labs Reviewed  COVID-19, FLU A+B NAA   Narrative:    Performed at:  [address], [place], [place]  [id number] Lab Director: Jolene Schimke MD, Phone:  [phone]  CULTURE, GROUP A STREP [facility])  POCT RAPID STREP A (OFFICE)    EKG   Radiology No results found.  Procedures Procedures (including critical care time)  Medications Ordered in UC Medications - No data to display  Initial Impression / Assessment and Plan / UC Course  I have reviewed the triage vital signs and the nursing notes.  Pertinent labs & imaging results that were available during my care of the patient were reviewed by me and considered in my medical decision making (see chart for details).     Vital signs reassuring, rapid strep neg, throat culture and COVID, flu test pending. Treat with viscous lidocaine, prednisone, supportive OTC medications and home care. Return for worsening sxs.  Final Clinical Impressions(s) / UC Diagnoses   Final diagnoses:  Sore throat  Cervical lymphadenopathy   Discharge Instructions   None    ED Prescriptions     Medication Sig Dispense Auth. Provider    lidocaine (XYLOCAINE) 2 % solution Use as directed 5 mLs in the mouth or throat as needed for mouth pain. 100 mL Particia Nearing, PA-C   predniSONE (DELTASONE) 20 MG tablet Take 2 tablets (40 mg total) by mouth daily with breakfast. 6 tablet Particia Nearing, [place]      PDMP not reviewed this encounter.   Particia Nearing, [place] 09/17/21 2232

## 2021-09-17 NOTE — Telephone Encounter (Signed)
Patient returned call and states she would like to try abx for remaining sore throat with positive non-group A strep.   Reviewed with patient, verified pharmacy, prescription sent

## 2021-09-26 ENCOUNTER — Other Ambulatory Visit: Payer: Self-pay

## 2021-09-26 ENCOUNTER — Ambulatory Visit
Admission: EM | Admit: 2021-09-26 | Discharge: 2021-09-26 | Disposition: A | Discharge status: Home / Self Care | Payer: No Typology Code available for payment source | Attending: Family Medicine | Admitting: Family Medicine

## 2021-09-26 DIAGNOSIS — J069 Acute upper respiratory infection, unspecified: Secondary | ICD-10-CM | POA: Diagnosis not present

## 2021-09-26 MED ORDER — PREDNISONE 20 MG PO TABS: 40.0000 mg | ORAL_TABLET | Freq: Every day | ORAL | 0 refills | Status: DC

## 2021-09-26 MED ORDER — DOXYCYCLINE HYCLATE 100 MG PO CAPS: 100.0000 mg | ORAL_CAPSULE | Freq: Two times a day (BID) | ORAL | 0 refills | Status: DC

## 2021-09-26 NOTE — ED Provider Notes (Signed)
[facility]    CSN: [id number] Arrival date & time: 09/26/21  1652      History   Chief Complaint Chief Complaint  Patient presents with   Generalized Body Aches    HPI Barbara Koch is a 35 y.o. female.   Presenting today with ongoing body aches, ear pressure and popping, congestion, diarrhea, cough, fatigue for the past 3 weeks.  She states she was treated for group B strep infection with Z-Pak, given prednisone and only had 1 or 2 days of improvement after finishing this course before symptoms returned.  She denies chest pain, shortness of breath, fevers, persisting sore throat.  Currently taking anything over-the-counter for symptoms.  No new sick contacts recently.   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   Past Surgical History:  Procedure Laterality Date   dislocated shoulder     right    OB History     Gravida  3   Para  1   Term  1   Preterm      AB  2   Living         SAB      IAB  2   Ectopic      Multiple      Live Births               Home Medications    Prior to Admission medications   Medication Sig Start Date End Date Taking? Authorizing Provider  doxycycline (VIBRAMYCIN) 100 MG capsule Take 1 capsule (100 mg total) by mouth 2 (two) times daily. 09/26/21  Yes Particia Nearing, PA-C  azithromycin (ZITHROMAX) 250 MG tablet Take 1 tablet (250 mg total) by mouth daily. Take first 2 tablets together, then 1 every day until finished. 09/17/21   LampteyBritta Mccreedy, MD  cholecalciferol (VITAMIN D3) 25 MCG (1000 UNIT) tablet Take 1,000 Units by mouth daily.    [provider]  clonazePAM (KLONOPIN) 1 MG tablet Take 1 mg by mouth 2 (two) times daily as needed. 06/04/21   [provider]  cloNIDine (CATAPRES) 0.1 MG tablet TAKE 1 TABLET BY MOUTH AT BEDTIME FOR SLEEP 01/25/19   [provider]  cyclobenzaprine (FLEXERIL) 10 MG tablet Take 1 tablet (10 mg total) by mouth 3 (three) times daily as needed for muscle spasms. 06/18/21   Bethann Berkshire, MD  DULoxetine (CYMBALTA) 60 MG capsule Take 120 mg by mouth daily.    [provider]  etonogestrel (NEXPLANON) 68 MG IMPL implant 1 each by Subdermal route once. 05/2013    [provider]  fluticasone (FLONASE) 50 MCG/ACT nasal spray Place 1 spray into both nostrils daily for 14 days. 04/06/20 04/20/20  Avegno, Zachery Dakins, FNP  gabapentin (NEURONTIN) 300 MG capsule 600 mg 2 (two) times daily. 01/25/19   [provider]  HYDROcodone-acetaminophen (NORCO/VICODIN) 5-325 MG tablet Take 1 tablet by mouth every 6 (six) hours as needed. 06/18/21   Bethann Berkshire, MD  lidocaine (XYLOCAINE) 2 % solution Use as directed 5 mLs in the mouth or throat as needed for mouth pain. 09/13/21   Particia Nearing, PA-C  lisdexamfetamine (VYVANSE) 30 MG capsule Take 30 mg by mouth daily.  [provider]  metroNIDAZOLE (FLAGYL) 500 MG tablet Take 1 tablet (500 mg total) by mouth 2 (two) times daily. Patient not taking: Reported on 06/18/2021 01/04/21   Merrilee Jansky, MD  mupirocin ointment (BACTROBAN) 2 % Apply 1 application topically 2 (two) times daily. Patient not taking: Reported on 06/18/2021 07/12/20   Wurst, [place], PA-C  nitrofurantoin, macrocrystal-monohydrate, (MACROBID) 100 MG capsule Take 1 capsule (100 mg total) by mouth 2 (two) times daily. Patient not taking: Reported on 06/18/2021 01/03/21   Rodriguez-Southworth, Nettie Elm, PA-C  predniSONE (DELTASONE) 20 MG tablet Take 2 tablets (40 mg total) by mouth daily with breakfast. 09/26/21   Particia Nearing, PA-C  predniSONE (STERAPRED UNI-PAK 21 TAB) 10 MG (21) TBPK tablet Take by mouth daily. Take 6 tabs by mouth daily  for 2 days, then 5  tabs for 2 days, then 4 tabs for 2 days, then 3 tabs for 2 days, 2 tabs for 2 days, then 1 tab by mouth daily for 2 days Patient not taking: No sig reported 07/12/20   Wurst, [place], PA-C  Turmeric (QC TUMERIC COMPLEX PO) Take by mouth.    [provider]  Zinc Citrate (ZINC EXTRA STRENGTH PO) Take by mouth.    [provider]    Family History Family History  Problem Relation Age of Onset   Depression Mother    Cancer Father    Cancer Other    Diabetes Other     Social History Social History   Tobacco Use   Smoking status: Every Day    Packs/day: 1.00    Types: Cigarettes   Smokeless tobacco: Never  Vaping Use   Vaping Use: Never used  Substance Use Topics   Alcohol use: Not Currently    Comment: occas   Drug use: Yes    Types: Marijuana    Comment: occ     Allergies   Celebrex [celecoxib] and Sulfa antibiotics   Review of Systems Review of Systems Per HPI  Physical Exam Triage Vital Signs ED Triage Vitals  Enc Vitals Group     BP 09/26/21 1705 115/75     Pulse Rate 09/26/21 1705 90     Resp 09/26/21 1705 19     Temp 09/26/21 1705 98.5 F (36.9 C)     Temp src --      SpO2 09/26/21 1705 96 %     Weight --      Height --      Head Circumference --      Peak Flow --      Pain Score 09/26/21 1704 6     Pain Loc --      Pain Edu? --      Excl. in GC? --    No data found.  Updated Vital Signs BP 115/75    Pulse 90    Temp 98.5 F (36.9 C)    Resp 19    LMP  (LMP Unknown)    SpO2 96%   Visual Acuity Right Eye Distance:   Left Eye Distance:   Bilateral Distance:    Right Eye Near:   Left Eye Near:    Bilateral Near:     Physical Exam Vitals and nursing note reviewed.  Constitutional:      Appearance: Normal appearance.  HENT:     Head: Atraumatic.     Right Ear: Tympanic membrane and external ear normal.     Left Ear: Tympanic membrane and external ear normal.  Nose: Congestion present.     Mouth/Throat:     Mouth:  Mucous membranes are moist.     Pharynx: Posterior oropharyngeal erythema present.  Eyes:     Extraocular Movements: Extraocular movements intact.     Conjunctiva/sclera: Conjunctivae normal.  Cardiovascular:     Rate and Rhythm: Normal rate and regular rhythm.     Heart sounds: Normal heart sounds.  Pulmonary:     Effort: Pulmonary effort is normal.     Breath sounds: Normal breath sounds. No wheezing or rales.  Musculoskeletal:        General: Normal range of motion.     Cervical back: Normal range of motion and neck supple.  Skin:    General: Skin is warm and dry.  Neurological:     Mental Status: She is alert and oriented to person, place, and time.  Psychiatric:        Mood and Affect: Mood normal.        Thought Content: Thought content normal.     UC Treatments / Results  Labs (all labs ordered are listed, but only abnormal results are displayed) Labs Reviewed - No data to display  EKG   Radiology No results found.  Procedures Procedures (including critical care time)  Medications Ordered in UC Medications - No data to display  Initial Impression / Assessment and Plan / UC Course  I have reviewed the triage vital signs and the nursing notes.  Pertinent labs & imaging results that were available during my care of the patient were reviewed by me and considered in my medical decision making (see chart for details).     Vital signs benign and reassuring, exam overall without red flag findings.  Will provide doxycycline, prednisone given persistent and severe nature of symptoms per patient.  Discussed supportive medications and home care.  Return for acutely worsening symptoms.  We will forego retesting as symptoms have been persistent for weeks at this time.  Final Clinical Impressions(s) / UC Diagnoses   Final diagnoses:  Upper respiratory tract infection, unspecified type   Discharge Instructions   None    ED Prescriptions     Medication Sig Dispense  Auth. Provider   doxycycline (VIBRAMYCIN) 100 MG capsule Take 1 capsule (100 mg total) by mouth 2 (two) times daily. 14 capsule Particia Nearing, [place]   predniSONE (DELTASONE) 20 MG tablet Take 2 tablets (40 mg total) by mouth daily with breakfast. 6 tablet Particia Nearing, [place]      PDMP not reviewed this encounter.   Particia Nearing, [place] 09/26/21 [phone]

## 2021-09-26 NOTE — ED Triage Notes (Addendum)
Pt presents with complaints of bodyaches, ear pressure, congestion, diarrhea and fatigue since 12/5.  Reports she was seen here on 12/9 and diagnosed with Group B strep. She took a Zpack for that.

## 2021-11-01 ENCOUNTER — Ambulatory Visit: Payer: No Typology Code available for payment source

## 2021-11-15 ENCOUNTER — Ambulatory Visit: Payer: No Typology Code available for payment source

## 2021-11-15 ENCOUNTER — Ambulatory Visit
Admission: RE | Admit: 2021-11-15 | Discharge: 2021-11-15 | Disposition: A | Discharge status: Home / Self Care | Payer: Medicaid Other | Source: Ambulatory Visit | Attending: Family Medicine | Admitting: Family Medicine

## 2021-11-15 VITALS — BP 133/87 | HR 81 | Temp 99.1°F | Resp 20

## 2021-11-15 DIAGNOSIS — N760 Acute vaginitis: Secondary | ICD-10-CM | POA: Diagnosis not present

## 2021-11-15 DIAGNOSIS — Z113 Encounter for screening for infections with a predominantly sexual mode of transmission: Secondary | ICD-10-CM | POA: Insufficient documentation

## 2021-11-15 DIAGNOSIS — R1012 Left upper quadrant pain: Secondary | ICD-10-CM | POA: Diagnosis not present

## 2021-11-15 DIAGNOSIS — Z202 Contact with and (suspected) exposure to infections with a predominantly sexual mode of transmission: Secondary | ICD-10-CM

## 2021-11-15 MED ORDER — ONDANSETRON 4 MG PO TBDP: 4.0000 mg | ORAL_TABLET | Freq: Three times a day (TID) | ORAL | 0 refills | Status: DC | PRN

## 2021-11-15 NOTE — ED Triage Notes (Signed)
Pt presents with c/o vaginal irritation , believes to be BV , wants std screen and hiv and rpr

## 2021-11-16 LAB — RPR: RPR Ser Ql: NONREACTIVE

## 2021-11-16 LAB — HIV ANTIBODY (ROUTINE TESTING W REFLEX): HIV Screen 4th Generation wRfx: NONREACTIVE

## 2021-11-18 LAB — CERVICOVAGINAL ANCILLARY ONLY
Bacterial Vaginitis (gardnerella): POSITIVE — AB
Candida Glabrata: NEGATIVE
Candida Vaginitis: NEGATIVE
Chlamydia: NEGATIVE
Comment: NEGATIVE
Comment: NEGATIVE
Comment: NEGATIVE
Comment: NEGATIVE
Comment: NEGATIVE
Comment: NORMAL
Neisseria Gonorrhea: NEGATIVE
Trichomonas: NEGATIVE

## 2021-11-19 ENCOUNTER — Telehealth (HOSPITAL_COMMUNITY): Payer: Self-pay | Admitting: Emergency Medicine

## 2021-11-19 MED ORDER — METRONIDAZOLE 500 MG PO TABS: 500.0000 mg | ORAL_TABLET | Freq: Two times a day (BID) | ORAL | 0 refills | Status: DC

## 2021-11-20 NOTE — ED Provider Notes (Signed)
[facility]    CSN: [id number] Arrival date & time: 11/15/21  1651      History   Chief Complaint Chief Complaint  Patient presents with   Vaginal Itching    HPI Barbara Koch is a 36 y.o. female.   Presenting today with vaginal irritation, nausea, left abdominal pain. Denies discharge, pelvic or abdominal pain, fever, chills, rashes, lesions. Not trying anything OTC for sxs. Requesting STI screening. Not trying anything OTC for sxs.    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   Past Surgical History:  Procedure Laterality Date   dislocated shoulder     right    OB History     Gravida  3   Para  1   Term  1   Preterm      AB  2   Living         SAB      IAB  2   Ectopic      Multiple      Live Births               Home Medications    Prior to Admission medications   Medication Sig Start Date End Date Taking? Authorizing Provider  ondansetron (ZOFRAN-ODT) 4 MG disintegrating tablet Take 1 tablet (4 mg total) by mouth every 8 (eight) hours as needed for nausea or vomiting. 11/15/21  Yes Particia Nearing, PA-C  azithromycin (ZITHROMAX) 250 MG tablet Take 1 tablet (250 mg total) by mouth daily. Take first 2 tablets together, then 1 every day until finished. 09/17/21   LampteyBritta Mccreedy, MD  cholecalciferol (VITAMIN D3) 25 MCG (1000 UNIT) tablet Take 1,000 Units by mouth daily.    [provider]  clonazePAM (KLONOPIN) 1 MG tablet Take 1 mg by mouth 2 (two) times daily as needed. 06/04/21   [provider]  cloNIDine (CATAPRES) 0.1 MG tablet TAKE 1 TABLET BY MOUTH AT BEDTIME FOR SLEEP 01/25/19   [provider]  cyclobenzaprine (FLEXERIL) 10 MG tablet Take 1 tablet (10 mg total) by  mouth 3 (three) times daily as needed for muscle spasms. 06/18/21   Bethann Berkshire, MD  doxycycline (VIBRAMYCIN) 100 MG capsule Take 1 capsule (100 mg total) by mouth 2 (two) times daily. 09/26/21   Particia Nearing, PA-C  DULoxetine (CYMBALTA) 60 MG capsule Take 120 mg by mouth daily.    [provider]  etonogestrel (NEXPLANON) 68 MG IMPL implant 1 each by Subdermal route once. 05/2013    [provider]  fluticasone (FLONASE) 50 MCG/ACT nasal spray Place 1 spray into both nostrils daily for 14 days. 04/06/20 04/20/20  Avegno, Zachery Dakins, FNP  gabapentin (NEURONTIN) 300 MG capsule 600 mg 2 (two) times daily. 01/25/19   [provider]  HYDROcodone-acetaminophen (NORCO/VICODIN) 5-325 MG tablet Take 1 tablet by mouth every 6 (six) hours as needed. 06/18/21   Bethann Berkshire, MD  lidocaine (XYLOCAINE) 2 % solution Use as directed 5 mLs in the mouth or throat as needed for mouth pain. 09/13/21   Particia Nearing, PA-C  lisdexamfetamine (VYVANSE) 30 MG capsule Take 30 mg by mouth daily.    [provider]  metroNIDAZOLE (FLAGYL)  500 MG tablet Take 1 tablet (500 mg total) by mouth 2 (two) times daily. 11/19/21   Lamptey, Britta Mccreedy, MD  mupirocin ointment (BACTROBAN) 2 % Apply 1 application topically 2 (two) times daily. Patient not taking: Reported on 06/18/2021 07/12/20   Wurst, [place], PA-C  nitrofurantoin, macrocrystal-monohydrate, (MACROBID) 100 MG capsule Take 1 capsule (100 mg total) by mouth 2 (two) times daily. Patient not taking: Reported on 06/18/2021 01/03/21   Rodriguez-Southworth, Nettie Elm, PA-C  predniSONE (DELTASONE) 20 MG tablet Take 2 tablets (40 mg total) by mouth daily with breakfast. 09/26/21   Particia Nearing, PA-C  predniSONE (STERAPRED UNI-PAK 21 TAB) 10 MG (21) TBPK tablet Take by mouth daily. Take 6 tabs by mouth daily  for 2 days, then 5 tabs for 2 days, then 4 tabs for 2 days, then 3 tabs for 2 days, 2 tabs for 2 days, then 1 tab by mouth  daily for 2 days Patient not taking: No sig reported 07/12/20   Wurst, [place], PA-C  Turmeric (QC TUMERIC COMPLEX PO) Take by mouth.    [provider]  Zinc Citrate (ZINC EXTRA STRENGTH PO) Take by mouth.    [provider]    Family History Family History  Problem Relation Age of Onset   Depression Mother    Cancer Father    Cancer Other    Diabetes Other     Social History Social History   Tobacco Use   Smoking status: Every Day    Packs/day: 1.00    Types: Cigarettes   Smokeless tobacco: Never  Vaping Use   Vaping Use: Never used  Substance Use Topics   Alcohol use: Not Currently    Comment: occas   Drug use: Yes    Types: Marijuana    Comment: occ     Allergies   Celebrex [celecoxib] and Sulfa antibiotics   Review of Systems Review of Systems PER HPI  Physical Exam Triage Vital Signs ED Triage Vitals  Enc Vitals Group     BP 11/15/21 1746 133/87     Pulse Rate 11/15/21 1746 81     Resp 11/15/21 1746 20     Temp 11/15/21 1746 99.1 F (37.3 C)     Temp src --      SpO2 11/15/21 1746 97 %     Weight --      Height --      Head Circumference --      Peak Flow --      Pain Score 11/15/21 1745 0     Pain Loc --      Pain Edu? --      Excl. in GC? --    No data found.  Updated Vital Signs BP 133/87    Pulse 81    Temp 99.1 F (37.3 C)    Resp 20    SpO2 97%   Visual Acuity Right Eye Distance:   Left Eye Distance:   Bilateral Distance:    Right Eye Near:   Left Eye Near:    Bilateral Near:     Physical Exam Vitals and nursing note reviewed.  Constitutional:      Appearance: Normal appearance. She is not ill-appearing.  HENT:     Head: Atraumatic.  Eyes:     Extraocular Movements: Extraocular movements intact.     Conjunctiva/sclera: Conjunctivae normal.  Cardiovascular:     Rate and Rhythm: Normal rate and regular rhythm.     Heart sounds: Normal heart sounds.  Pulmonary:     Effort: Pulmonary effort is normal.      Breath sounds: Normal breath sounds.  Abdominal:     General: Bowel sounds are normal. There is no distension.     Palpations: Abdomen is soft.     Tenderness: There is abdominal tenderness (minimal left abdominal ttp). There is no guarding.  Genitourinary:    Comments: GU exam deferred, self swab performed Musculoskeletal:        General: Normal range of motion.     Cervical back: Normal range of motion and neck supple.  Skin:    General: Skin is warm and dry.  Neurological:     Mental Status: She is alert and oriented to person, place, and time.  Psychiatric:        Mood and Affect: Mood normal.        Thought Content: Thought content normal.        Judgment: Judgment normal.     UC Treatments / Results  Labs (all labs ordered are listed, but only abnormal results are displayed) Labs Reviewed  CERVICOVAGINAL ANCILLARY ONLY - Abnormal; Notable for the following components:      Result Value   Bacterial Vaginitis (gardnerella) Positive (*)    All other components within normal limits  HIV ANTIBODY (ROUTINE TESTING W REFLEX)   Narrative:    Performed at:  [id number] - [facility] [address], [place], [place]  [id number] Lab Director: Jolene SchimkeSanjai Nagendra MD, Phone:  [phone]  RPR   Narrative:    Performed at:  [address] - [facility] [address], [place], [place]  [id number] Lab Director: Jolene SchimkeSanjai Nagendra MD, Phone:  [phone]    EKG   Radiology No results found.  Procedures Procedures (including critical care time)  Medications Ordered in UC Medications - No data to display  Initial Impression / Assessment and Plan / UC Course  I have reviewed the triage vital signs and the nursing notes.  Pertinent labs & imaging results that were available during my care of the patient were reviewed by me and considered in my medical decision making (see chart for details).     Vitals and exam reassuring, vaginal swab, HIV and syphilis pending. Zofran sent for nausea,  discussed supportive home care. Return for acutely worsening sxs.  Final Clinical Impressions(s) / UC Diagnoses   Final diagnoses:  Acute vaginitis  Left upper quadrant abdominal pain   Discharge Instructions   None    ED Prescriptions     Medication Sig Dispense Auth. Provider   ondansetron (ZOFRAN-ODT) 4 MG disintegrating tablet Take 1 tablet (4 mg total) by mouth every 8 (eight) hours as needed for nausea or vomiting. 20 tablet Particia NearingLane, Aragon Scarantino [place], [place]PA-C      PDMP not reviewed this encounter.   Particia NearingLane, Janiah Devinney [place], [place]-C 11/20/21 2208

## 2022-01-13 ENCOUNTER — Encounter: Payer: Self-pay | Admitting: Obstetrics & Gynecology

## 2022-01-13 ENCOUNTER — Ambulatory Visit (INDEPENDENT_AMBULATORY_CARE_PROVIDER_SITE_OTHER): Payer: No Typology Code available for payment source | Admitting: Obstetrics & Gynecology

## 2022-01-13 ENCOUNTER — Other Ambulatory Visit (HOSPITAL_COMMUNITY)
Admission: RE | Admit: 2022-01-13 | Discharge: 2022-01-13 | Disposition: A | Discharge status: Home / Self Care | Payer: No Typology Code available for payment source | Source: Ambulatory Visit | Attending: Obstetrics & Gynecology | Admitting: Obstetrics & Gynecology

## 2022-01-13 VITALS — BP 134/89 | HR 102 | Ht 60.0 in | Wt 186.0 lb

## 2022-01-13 DIAGNOSIS — Z124 Encounter for screening for malignant neoplasm of cervix: Secondary | ICD-10-CM | POA: Insufficient documentation

## 2022-01-13 DIAGNOSIS — Z113 Encounter for screening for infections with a predominantly sexual mode of transmission: Secondary | ICD-10-CM

## 2022-01-13 DIAGNOSIS — Z01419 Encounter for gynecological examination (general) (routine) without abnormal findings: Secondary | ICD-10-CM | POA: Insufficient documentation

## 2022-01-13 NOTE — Addendum Note (Signed)
Addended by: Moss McRESENZO, Skye Plamondon M on: 01/13/2022 11:43 AM ? ? Modules accepted: Orders ? ?

## 2022-01-13 NOTE — Progress Notes (Signed)
? ?  WELL-WOMAN EXAMINATION ?Patient name: Barbara Koch MRN [id number]  Date of birth: [birth year] ?Chief Complaint:   ?Colposcopy ? ?History of Present Illness:   ?Barbara Koch is a 36 y.o. G72P1020  female being seen today for a routine well-woman exam ? ?Notes h/o abnormal pap- records reviewed 2018- LSIL, HPV+ ? ?Sexually active- desires STI screening.  Denies postcoital bleeding ?Denies issues with her menses- no menses with device placed ?Sept 2022 ? ?No LMP recorded. Patient has had an implant. ? ?The current method of family planning is Nexplanon.  ? ?Last pap 2018- LSIL, HPV+.  ?Last mammogram: n/a. ?Last colonoscopy: n/a ? ?   ? View : No data to display.  ?  ?  ?  ? ? ? ? ?Review of Systems:   ?Pertinent items are noted in HPI ?Denies any headaches, blurred vision, fatigue, shortness of breath, chest pain, abdominal pain, bowel movements, urination, or intercourse unless otherwise stated above. ? ?Pertinent History Reviewed:  ?Reviewed past medical,surgical, social and family history.  ?Reviewed problem list, medications and allergies. ?Physical Assessment:  ? ?Vitals:  ? 01/13/22 1019  ?BP: 134/89  ?Pulse: (!) 102  ?Weight: 186 lb (84.4 kg)  ?Height: 5' (1.524 m)  ?Body mass index is 36.33 kg/m?. ?  ?     Physical Examination:  ? General appearance - well appearing, and in no distress ? Mental status - alert, oriented to person, place, and time ? Psych:  She has a normal mood and affect ? Skin - warm and dry, normal color, no suspicious lesions noted ? Chest - effort normal, all lung fields clear to auscultation bilaterally ? Heart - normal rate and regular rhythm ? Neck:  midline trachea, no thyromegaly or nodules ? Breasts - breasts appear normal, no suspicious masses, no skin or nipple changes or  axillary nodes, recent bilateral piercing- no evidence of infection ? Abdomen - soft, nontender, nondistended, no masses or organomegaly ? Pelvic - VULVA: normal appearing vulva with no masses, tenderness or  lesions  VAGINA: normal appearing vagina with normal color and discharge, no lesions  CERVIX: normal appearing cervix without discharge or lesions, no CMT ? Thin prep pap is done with HR HPV cotesting ? UTERUS: uterus is felt to be normal size, shape, consistency and nontender  ? ADNEXA: No adnexal masses or tenderness noted. ?  ? Extremities:  No swelling or varicosities noted, nexplanon palpable in left arm ? ?Chaperone: Alice Rieger   ? ? ?Assessment & Plan:  ?1) Well-Woman Exam ?-pap collected, reviewed ASCCP guidelines ?-reviewed prior pap ? ?2) Contraception ?-nexplanon in place ? ?3) STI screening to be completed ? ?Orders Placed This Encounter  ?Procedures  ? RPR  ? Hepatitis C antibody  ? HIV Antibody (routine testing w rflx)  ? Hepatitis B surface antigen  ? ? ?Meds: No orders of the defined types were placed in this encounter. ? ? ?Follow-up: No follow-ups on file. ? ? ?Janyth Pupa, DO ?Attending [place] ?[facility] ? ? ?

## 2022-01-14 LAB — HEPATITIS B SURFACE ANTIGEN: Hepatitis B Surface Ag: NEGATIVE

## 2022-01-14 LAB — RPR: RPR Ser Ql: NONREACTIVE

## 2022-01-14 LAB — HEPATITIS C ANTIBODY: Hep C Virus Ab: NONREACTIVE

## 2022-01-14 LAB — HIV ANTIBODY (ROUTINE TESTING W REFLEX): HIV Screen 4th Generation wRfx: NONREACTIVE

## 2022-01-15 LAB — CYTOLOGY - PAP
Adequacy: ABSENT
Chlamydia: NEGATIVE
Comment: NEGATIVE
Comment: NEGATIVE
Comment: NEGATIVE
Comment: NEGATIVE
Comment: NORMAL
Diagnosis: UNDETERMINED — AB
HPV 16: NEGATIVE
HPV 18 / 45: NEGATIVE
High risk HPV: POSITIVE — AB
Neisseria Gonorrhea: NEGATIVE

## 2022-01-16 ENCOUNTER — Telehealth: Payer: Self-pay

## 2022-01-16 NOTE — Telephone Encounter (Signed)
-----   Message from Myna Hidalgo, DO sent at 01/16/2022  8:16 AM EDT ----- ?Please call pt to schedule colposcopy and to review results ?

## 2022-01-16 NOTE — Telephone Encounter (Signed)
Called pt per Dr Charlotta Newton to relay test results and schedule colposcopy. Two identifiers used. Pt confirmed understanding. Pt's call transferred to front to schedule appt. ?

## 2022-01-22 ENCOUNTER — Encounter: Payer: Self-pay | Admitting: Medical

## 2022-01-22 ENCOUNTER — Other Ambulatory Visit (HOSPITAL_COMMUNITY)
Admission: RE | Admit: 2022-01-22 | Discharge: 2022-01-22 | Disposition: A | Discharge status: Home / Self Care | Payer: No Typology Code available for payment source | Source: Ambulatory Visit | Attending: Medical | Admitting: Medical

## 2022-01-22 ENCOUNTER — Ambulatory Visit (INDEPENDENT_AMBULATORY_CARE_PROVIDER_SITE_OTHER): Payer: No Typology Code available for payment source | Admitting: Medical

## 2022-01-22 VITALS — BP 141/85 | HR 107 | Ht 60.0 in | Wt 186.0 lb

## 2022-01-22 DIAGNOSIS — Z3202 Encounter for pregnancy test, result negative: Secondary | ICD-10-CM | POA: Diagnosis not present

## 2022-01-22 DIAGNOSIS — R87811 Vaginal high risk human papillomavirus (HPV) DNA test positive: Secondary | ICD-10-CM

## 2022-01-22 DIAGNOSIS — R87620 Atypical squamous cells of undetermined significance on cytologic smear of vagina (ASC-US): Secondary | ICD-10-CM | POA: Diagnosis present

## 2022-01-22 NOTE — Progress Notes (Signed)
? ? ? ?  GYNECOLOGY CLINIC COLPOSCOPY PROCEDURE NOTE ? ?Ms. SIMRANPREET FALZON is a 36 y.o. [phone] here for colposcopy for ASCUS with POSITIVE high risk HPV pap smear on 01/13/2022. Patient also had LSIL pap 2018 with a negative Colpo in 2019. Discussed role for HPV in cervical dysplasia, need for surveillance. ? ?Patient given informed consent, signed copy in the chart, time out was performed.  Placed in lithotomy position. Cervix viewed with speculum and colposcope after application of acetic acid.  ? ?Colposcopy adequate? Yes ? no visible lesions and punctation noted at 8 o'clock; biopsies obtained.  ECC specimen obtained. ?All specimens were labelled and sent to pathology. ? ?Patient was given post procedure instructions.  Will follow up pathology and manage accordingly.  Routine preventative health maintenance measures emphasized. ? ? ?Marny Lowenstein, PA-C ?01/22/2022 2:30 PM  ? ?

## 2022-01-24 LAB — SURGICAL PATHOLOGY

## 2022-09-25 IMAGING — CT CT HEAD W/O CM
3 series · 15 of 47 positions shown, 18 images · non-contrast
Comparison: None.

CLINICAL DATA: Head trauma, minor, normal mental status (Age
19-64y). Fall

EXAM:
CT HEAD WITHOUT CONTRAST
TECHNIQUE: Contiguous axial images were obtained from the base of the skull
through the vertex without intravenous contrast.

[Series 3: head w o · axial · 0.43mm/px · z∈[+35,+165]mm · 9 of 32 slices shown, 12 images]
[im 3/32  brain]
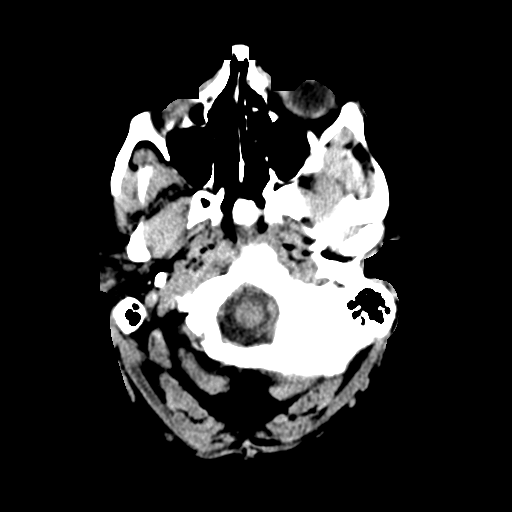
[im 3/32  bone]
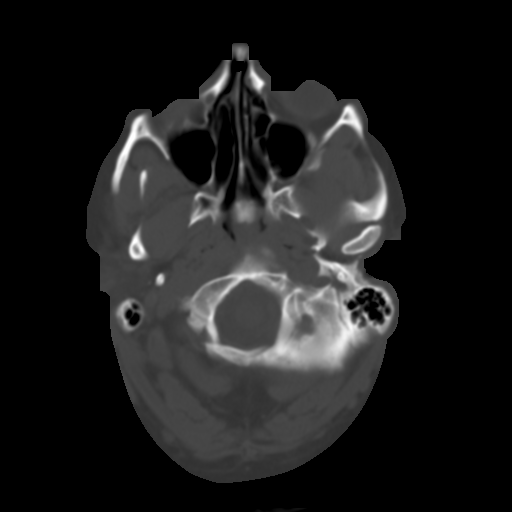
[im 6/32  brain]
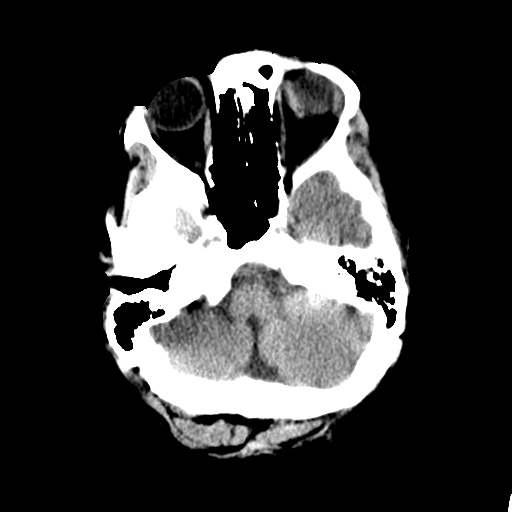
[im 9/32  brain]
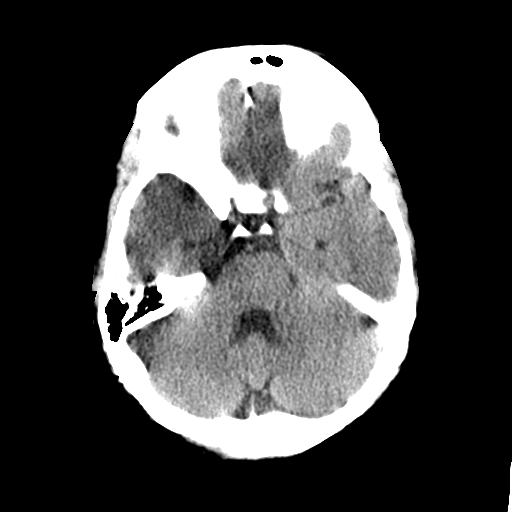
[im 12/32  brain]
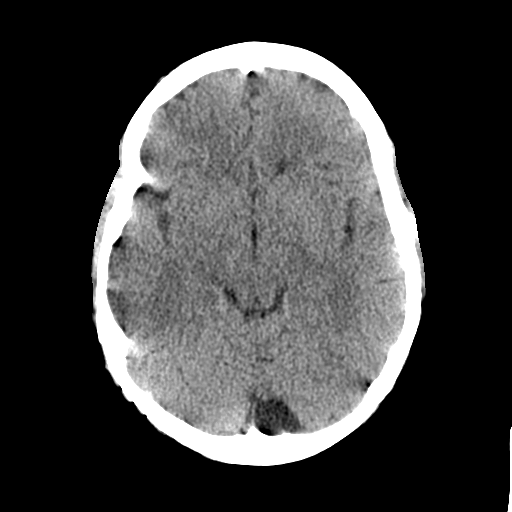
[im 17/32  brain]
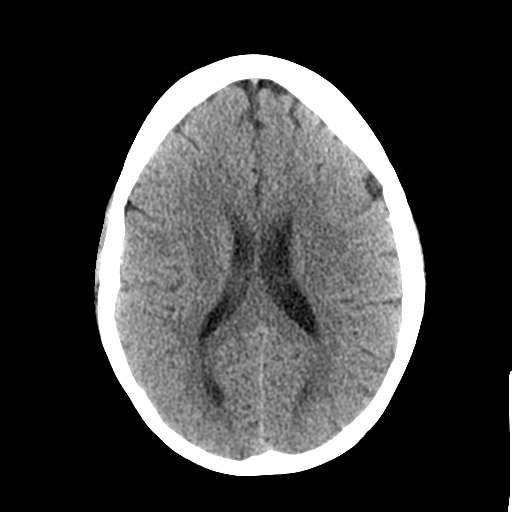
[im 17/32  bone]
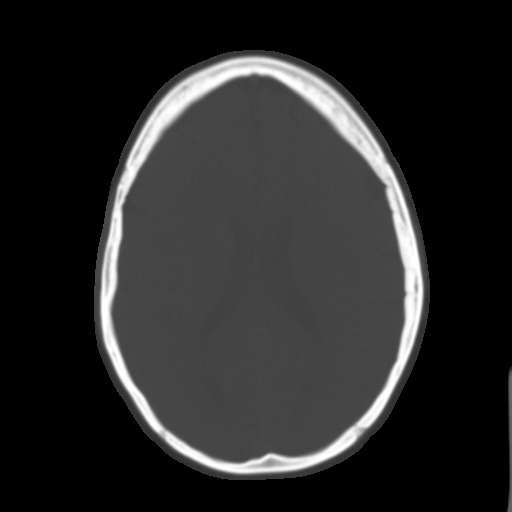
[im 20/32  brain]
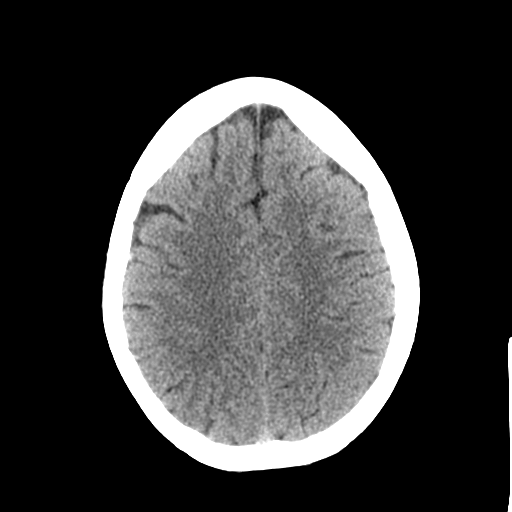
[im 23/32  brain]
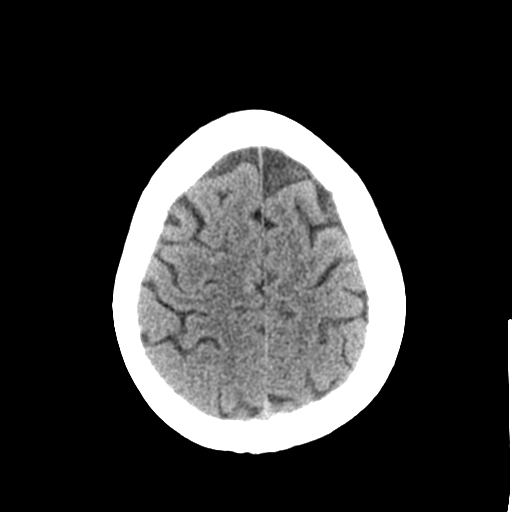
[im 26/32  brain]
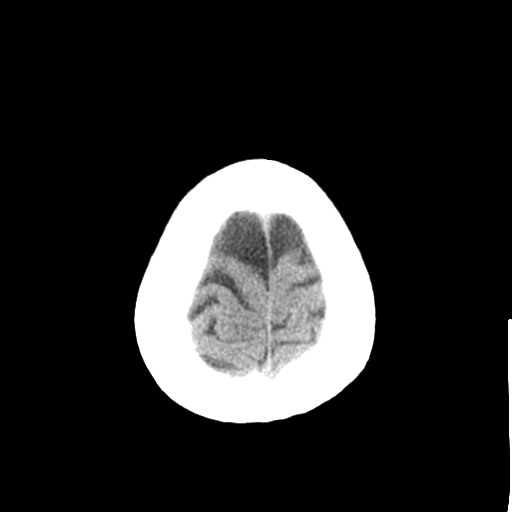
[im 29/32  brain]
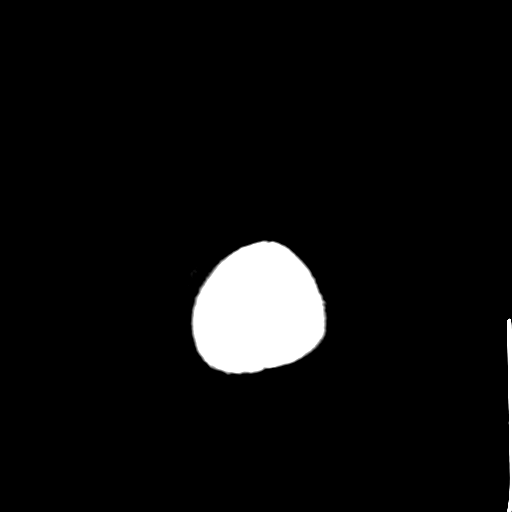
[im 29/32  bone]
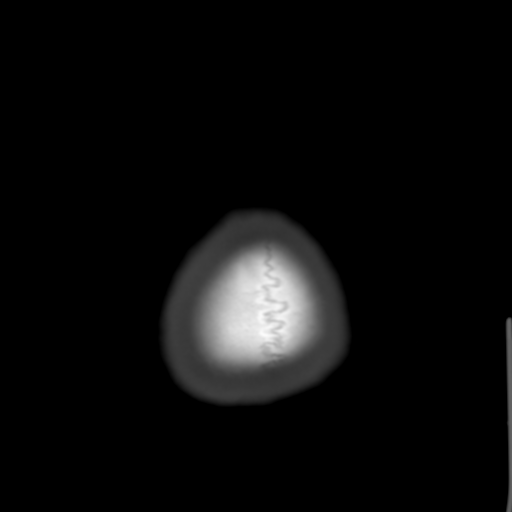

[Series 4: coronal soft · coronal · 0.31mm/px · 3 of 72 slices shown]
[im 24/72  brain]
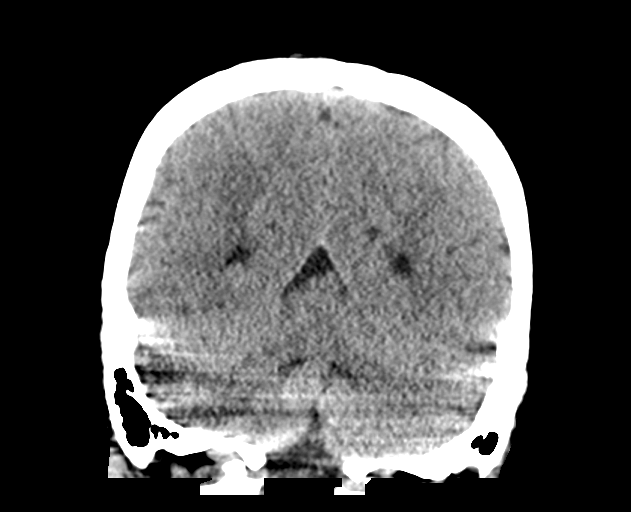
[im 32/72  brain]
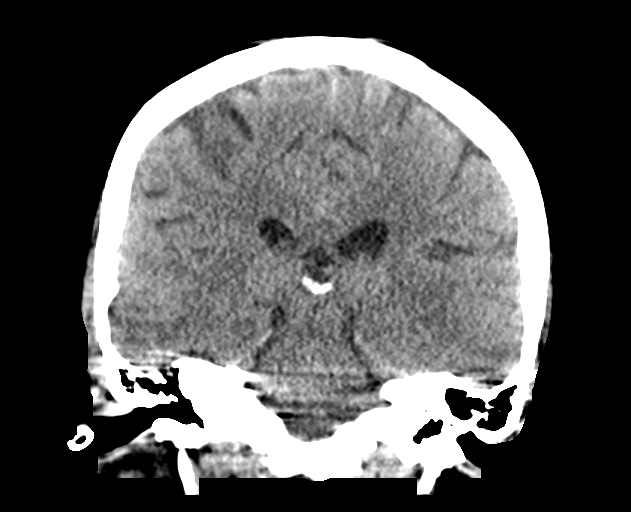
[im 40/72  brain]
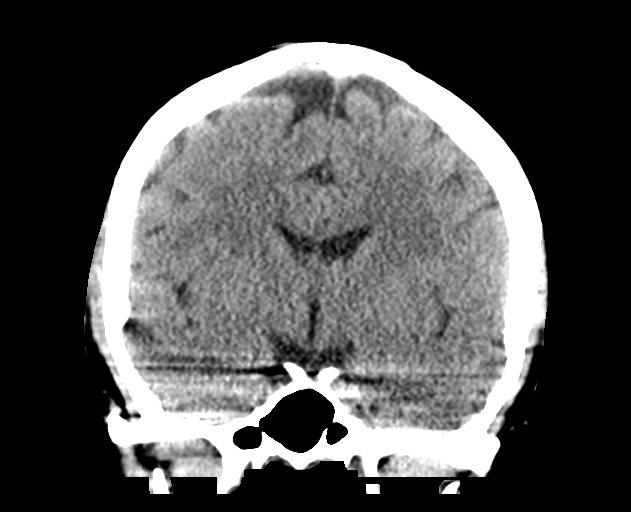

[Series 5: sagittal soft · sagittal · 0.31mm/px · 3 of 58 slices shown]
[im 20/58  brain]
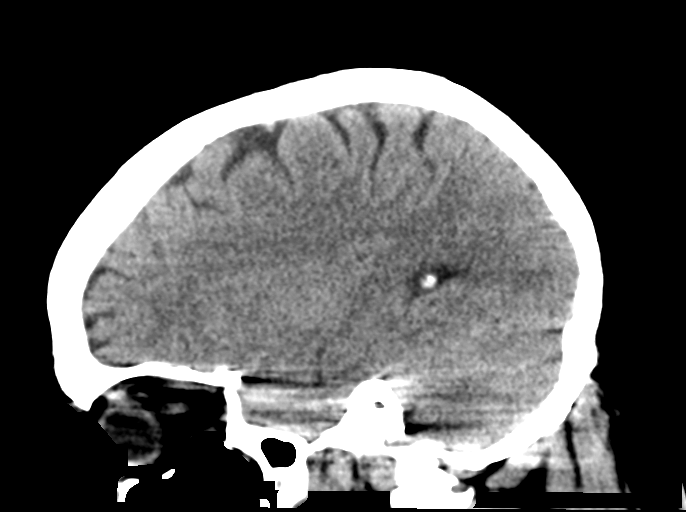
[im 29/58  brain]
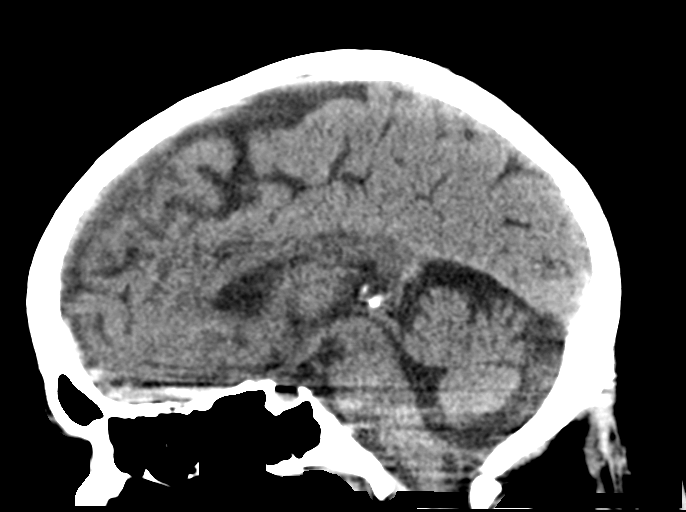
[im 39/58  brain]
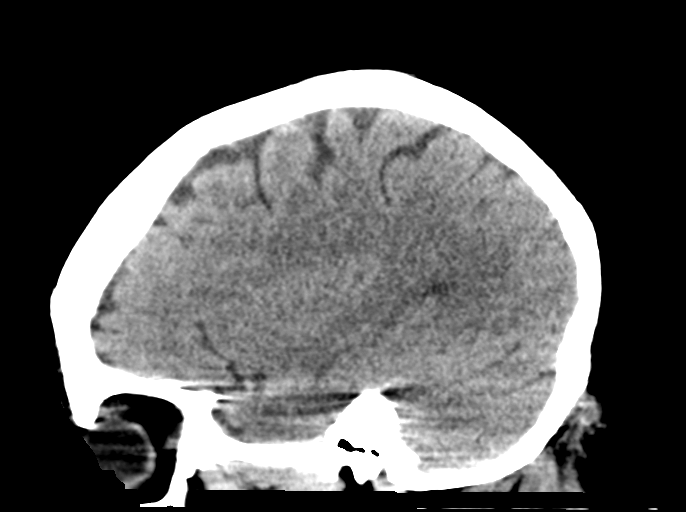

[15 of 47 positions shown; findings below may reference images not displayed]

FINDINGS: Brain: No acute intracranial abnormality. Specifically, no
hemorrhage, hydrocephalus, mass lesion, acute infarction, or
significant intracranial injury.

Vascular: No hyperdense vessel or unexpected calcification.

Skull: No acute calvarial abnormality.

Sinuses/Orbits: No acute findings

Other: None
IMPRESSION: No acute intracranial abnormality.

## 2022-12-30 ENCOUNTER — Encounter: Payer: Self-pay | Admitting: Neurology

## 2023-01-29 ENCOUNTER — Ambulatory Visit
Admission: EM | Admit: 2023-01-29 | Discharge: 2023-01-29 | Disposition: A | Discharge status: Home / Self Care | Payer: No Typology Code available for payment source | Attending: Family Medicine | Admitting: Family Medicine

## 2023-01-29 DIAGNOSIS — N898 Other specified noninflammatory disorders of vagina: Secondary | ICD-10-CM | POA: Diagnosis not present

## 2023-01-29 DIAGNOSIS — R300 Dysuria: Secondary | ICD-10-CM | POA: Diagnosis not present

## 2023-01-29 LAB — POCT URINE PREGNANCY: Preg Test, Ur: NEGATIVE

## 2023-01-29 LAB — POCT URINALYSIS DIP (MANUAL ENTRY)
Bilirubin, UA: NEGATIVE
Glucose, UA: NEGATIVE mg/dL
Ketones, POC UA: NEGATIVE mg/dL
Nitrite, UA: NEGATIVE
Protein Ur, POC: NEGATIVE mg/dL
Spec Grav, UA: 1.02 (ref 1.010–1.025)
Urobilinogen, UA: 0.2 E.U./dL
pH, UA: 6 (ref 5.0–8.0)

## 2023-01-29 MED ORDER — FLUCONAZOLE 150 MG PO TABS: ORAL_TABLET | ORAL | 0 refills | Status: DC

## 2023-01-29 MED ORDER — CEPHALEXIN 500 MG PO CAPS: 500.0000 mg | ORAL_CAPSULE | Freq: Two times a day (BID) | ORAL | 0 refills | Status: DC

## 2023-01-29 NOTE — ED Triage Notes (Signed)
Pt reports she has burning when she urinates, increased lower back pain, discomfort in her vagina, itchy vagina x 3 days ago.   Last sexual encounter unprotected was x 1 month , pt states.  No discharge  Pt already has low back pain so it is hard to tell what is the cause.

## 2023-01-29 NOTE — Progress Notes (Signed)
NEUROLOGY CONSULTATION NOTE  Barbara Koch MRN: [id number] DOB: 02-27-1986  Referring provider: [facility] Primary care provider: [facility]  Reason for consult:  migraines  Assessment/Plan:   Chronic migraine with aura, without status migrainosus, not intractable - at least 15 headache days a month for over 3 consecutive months - failed multiple preventative medications.  Migraine prevention:  Plan to restart Botox Migraine rescue:  She will try samples of Nurtec as needed. Limit use of pain relievers to no more than 2 days out of week to prevent risk of rebound or medication-overuse headache. Keep headache diary Follow up for Botox    Subjective:  Barbara Koch is a 37 year old female with ADHD, PTSD, rheumatoid arthritis, chronic pain, depression and anxiety who presents for migraines.  History supplemented by referring provider's note.  She has a persistent 3/10 daily headache  Onset:  teenager Location:  temples, occipital bilateral Quality:  pressure, throbbing towards end of day Intensity:  3/10 (baseline), 7/10 when severe   Aura:  floaters, dark peripheral vision Prodrome:  absent Associated symptoms:  photophobia, phonophobia, osmophobia.  Mild nausea.  She denies associated vomiting,unilateral numbness or weakness. Duration:  3-4 hours Frequency:  4 days a week Frequency of abortive medication: ibuprofen once every 2 weeks Triggers:  seasonal, MSG, cheese Relieving factors:  rest in dark and quiet room, cold pack, sleep Activity:  aggravates  Past NSAIDS/analgesics:  Anacin, BC, Goody's, Tylenol #3, Fioricet, hydrocodone, Cambia, Celebrex (adverse reaction), naproxen Past abortive triptans:  sumatriptan tab/inj, rizatriptan, eletriptan, zolmitriptan Past abortive ergotamine:  none Past muscle relaxants:  Flexeril, baclofen, Robaxin, Skelaxin, tizanidine Past anti-emetic:  Zofran Past antihypertensive medications:  none Past  antidepressant medications:  amitriptyline, venlafaxine, mirtazapine, sertraline, fluoxetine, imipramine Past anticonvulsant medications:  topiramate Past anti-CGRP:  Aimovig (injection site reaction), Bernita Raisin Past vitamins/Herbal/Supplements:  magnesium, riboflavin Past antihistamines/decongestants:  Claritin, Zyrtec, Sudafed Other past therapies:  Botox (s/p 1 injection - practice closed but noticed improvement), trigger point injections  Current NSAIDS/analgesics:  ibuprofen Current triptans:  none Current ergotamine:  none Current anti-emetic:  none Current muscle relaxants:  none Current Antihypertensive medications:  clonidine (for sleep) Current Antidepressant medications:  duloxetine 120mg  daily Current Anticonvulsant medications:  gabapentin 600mg  AM and 1200mg  QHS Current anti-CGRP:  none Current Vitamins/Herbal/Supplements:  none Current Antihistamines/Decongestants:  loratidine Other therapy:  none Birth control:  Nexplanon Other medications:  clonazepam, Vyvanse   Caffeine:  daily soft drinks, rarely coffee Depression:  yes; Anxiety:  yes Other pain:  fibromyalgia Family history of headache:  mother (migraines)  CT head on 06/18/2021 personally reviewed was normal.   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     clonazePAM (KLONOPIN) 1 MG  tablet Take 1 mg by mouth 2 (two) times daily as needed.     cloNIDine (CATAPRES) 0.1 MG tablet TAKE 1 TABLET BY MOUTH AT BEDTIME FOR SLEEP     cyclobenzaprine (FLEXERIL) 10 MG tablet Take 1 tablet (10 mg total) by mouth 3 (three) times daily as needed for muscle spasms. (Patient not taking: Reported on 01/13/2022) 20 tablet 0   DULoxetine (CYMBALTA) 60 MG capsule Take 120 mg by mouth daily.     etonogestrel (NEXPLANON) 68 MG IMPL implant 1 each by Subdermal route once. 05/2013     fluticasone (FLONASE) 50 MCG/ACT nasal spray Place 1 spray into both nostrils daily for 14 days. 16 g 0   gabapentin (NEURONTIN) 300 MG capsule 600 mg 2 (two) times daily.     lidocaine (XYLOCAINE) 2 % solution Use as directed 5 mLs in the mouth or throat as needed for mouth pain. (Patient not taking: Reported on 01/13/2022) 100 mL 0   lisdexamfetamine (VYVANSE) 30 MG capsule Take 30 mg by mouth daily.     loratadine (CLARITIN) 10 MG tablet Take 10 mg by mouth daily.     Turmeric (QC TUMERIC COMPLEX PO) Take by mouth.     Zinc Citrate (ZINC EXTRA STRENGTH PO) Take by mouth.     No current facility-administered medications on file prior to visit.    ALLERGIES: Allergies  Allergen Reactions   Celebrex [Celecoxib] Hives    Can take other NSAIDs   Sulfa Antibiotics     Hives     FAMILY HISTORY: Family History  Problem Relation Age of Onset   Depression Mother    Cancer Father    Cancer Other    Diabetes Other     Objective:  Blood pressure (!) 153/76, pulse 99, height 5' (1.524 m), weight 195 lb (88.5 kg), SpO2 95 %. General: No acute distress.  Patient appears well-groomed.   Head:  Normocephalic/atraumatic Eyes:  fundi examined but not visualized Neck: supple, no paraspinal tenderness, full range of motion Back: No paraspinal tenderness Heart: regular rate and rhythm Lungs: Clear to auscultation  bilaterally. Vascular: No carotid bruits. Neurological Exam: Mental status: alert and oriented to person, place, and time, speech fluent and not dysarthric, language intact. Cranial nerves: CN I: not tested CN II: pupils equal, round and reactive to light, visual fields intact CN III, IV, VI:  full range of motion, no nystagmus, no ptosis CN V: facial sensation intact. CN VII: upper and lower face symmetric CN VIII: hearing intact CN IX, X: gag intact, uvula midline CN XI: sternocleidomastoid and trapezius muscles intact CN XII: tongue midline Bulk & Tone: normal, no fasciculations. Motor:  muscle strength 5/5 throughout Sensation:  Pinprick, temperature and vibratory sensation intact. Deep Tendon Reflexes:  2+ throughout,  toes downgoing.   Finger to nose testing:  Without dysmetria.   Heel to shin:  Without dysmetria.   Gait:  Normal station and stride.  Romberg negative.    Thank you for allowing me to take part in the care of this patient.  Shon Millet, DO

## 2023-01-29 NOTE — ED Provider Notes (Signed)
[facility]   [id number] 01/29/23 Arrival Time: 1107  ASSESSMENT & PLAN:  1. Vaginal itching   2. Dysuria    Meds ordered this encounter  Medications   cephALEXin (KEFLEX) 500 MG capsule    Sig: Take 1 capsule (500 mg total) by mouth 2 (two) times daily.    Dispense:  10 capsule    Refill:  0   fluconazole (DIFLUCAN) 150 MG tablet    Sig: Take one tablet by mouth as a single dose. May repeat in 3 days if symptoms persist.    Dispense:  2 tablet    Refill:  0   Vaginal cytology and urine culture pending. Will tx empirically for cystitis and yeast infection.    Discharge Instructions      In addition to a urine culture, we have sent testing for various causes of vaginal infections. We will notify you of any positive results once they are received. If required, we will prescribe any medications you might need.  Please refrain from all sexual activity for at least the next seven days.     Without s/s of PID.  Labs Reviewed  POCT URINALYSIS DIP (MANUAL ENTRY) - Abnormal; Notable for the following components:      Result Value   Blood, UA trace-lysed (*)    Leukocytes, UA Trace (*)    All other components within normal limits  POCT URINE PREGNANCY Negative   Reviewed expectations re: course of current medical issues. Questions answered. Outlined signs and symptoms indicating need for more acute intervention. Patient verbalized understanding. After Visit Summary given.   SUBJECTIVE:  Barbara Koch is a 37 y.o. female who presents with complaint of vaginal itching and dysuria. Pwith assoc mild bilateral lower back pain. Past 3 days. Denies fever/n/v/abd pain.  Last sexual encounter unprotected was x 1 month.  No LMP recorded (within years). Patient has had an implant.   OBJECTIVE:  Vitals:   01/29/23 1202 01/29/23 1307  BP: (!) 150/97 137/88  Pulse: 89   Resp: 20   Temp: 98.4 F (36.9 C)   TempSrc: Oral   SpO2: 97%     General appearance:  alert, cooperative, appears stated age and no distress Lungs: unlabored respirations; speaks full sentences without difficulty Back: no CVA tenderness; FROM at waist Abdomen: benign GU: deferred Skin: warm and dry Psychological: alert and cooperative; normal mood and affect.  Results for orders placed or performed during the hospital encounter of 01/29/23  POCT urinalysis dipstick  Result Value Ref Range   Color, UA yellow yellow   Clarity, UA clear clear   Glucose, UA negative negative mg/dL   Bilirubin, UA negative negative   Ketones, POC UA negative negative mg/dL   Spec Grav, UA 8.295 6.213 - 1.025   Blood, UA trace-lysed (A) negative   pH, UA 6.0 5.0 - 8.0   Protein Ur, POC negative negative mg/dL   Urobilinogen, UA 0.2 0.2 or 1.0 E.U./dL   Nitrite, UA Negative Negative   Leukocytes, UA Trace (A) Negative  POCT urine pregnancy  Result Value Ref Range   Preg Test, Ur Negative Negative    Labs Reviewed  POCT URINALYSIS DIP (MANUAL ENTRY) - Abnormal; Notable for the following components:      Result Value   Blood, UA trace-lysed (*)    Leukocytes, UA Trace (*)    All other components within normal limits  URINE CULTURE  POCT URINE PREGNANCY  CERVICOVAGINAL ANCILLARY ONLY    Allergies  Allergen  Reactions   Celebrex [Celecoxib] Hives    Can take other NSAIDs   Sulfa Antibiotics     Hives     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   Rheumatoid arthritis    Family History  Problem Relation Age of Onset   Depression Mother    Cancer Father    Cancer Other    Diabetes Other    Social History   Socioeconomic History   Marital status: Single    Spouse name: Not on file   Number of children: Not on file   Years of education: Not on file   Highest education level: Not on file  Occupational History   Not on file  Tobacco  Use   Smoking status: Every Day    Packs/day: 1    Types: Cigarettes   Smokeless tobacco: Never  Vaping Use   Vaping Use: Never used  Substance and Sexual Activity   Alcohol use: Not Currently    Comment: occas   Drug use: Yes    Types: Marijuana    Comment: occ   Sexual activity: Yes    Birth control/protection: Implant  Other Topics Concern   Not on file  Social History Narrative   Not on file   Social Determinants of Health   Financial Resource Strain: Not on file  Food Insecurity: Not on file  Transportation Needs: Not on file  Physical Activity: Not on file  Stress: Not on file  Social Connections: Not on file  Intimate Partner Violence: Not on file           Chical, MD 01/29/23 1342

## 2023-01-29 NOTE — Discharge Instructions (Signed)
In addition to a urine culture, we have sent testing for various causes of vaginal infections. We will notify you of any positive results once they are received. If required, we will prescribe any medications you might need.  Please refrain from all sexual activity for at least the next seven days.

## 2023-01-30 LAB — CERVICOVAGINAL ANCILLARY ONLY
Bacterial Vaginitis (gardnerella): NEGATIVE
Candida Glabrata: NEGATIVE
Candida Vaginitis: POSITIVE — AB
Chlamydia: NEGATIVE
Comment: NEGATIVE
Comment: NEGATIVE
Comment: NEGATIVE
Comment: NEGATIVE
Comment: NEGATIVE
Comment: NORMAL
Neisseria Gonorrhea: NEGATIVE
Trichomonas: NEGATIVE

## 2023-01-30 LAB — URINE CULTURE: Culture: <10000 — AB

## 2023-02-02 ENCOUNTER — Ambulatory Visit (INDEPENDENT_AMBULATORY_CARE_PROVIDER_SITE_OTHER): Payer: No Typology Code available for payment source | Admitting: Neurology

## 2023-02-02 ENCOUNTER — Encounter: Payer: Self-pay | Admitting: Neurology

## 2023-02-02 VITALS — BP 137/67 | HR 99 | Ht 60.0 in | Wt 195.0 lb

## 2023-02-02 DIAGNOSIS — G43E09 Chronic migraine with aura, not intractable, without status migrainosus: Secondary | ICD-10-CM | POA: Diagnosis not present

## 2023-02-02 MED ORDER — NURTEC 75 MG PO TBDP: ORAL_TABLET | ORAL | 0 refills | Status: DC

## 2023-02-02 NOTE — Patient Instructions (Signed)
Start Botox Try taking Nurtec (one daily as needed) for migraine attack and let me know if effective Limit use of pain relievers to no more than 2 days out of week to prevent risk of rebound or medication-overuse headache. Keep headache diary

## 2023-02-17 ENCOUNTER — Telehealth: Payer: Self-pay

## 2023-02-17 NOTE — Telephone Encounter (Signed)
Patient to start Botox

## 2023-02-26 ENCOUNTER — Other Ambulatory Visit (HOSPITAL_COMMUNITY): Payer: Self-pay

## 2023-02-26 ENCOUNTER — Telehealth: Payer: Self-pay | Admitting: Pharmacy Technician

## 2023-02-26 DIAGNOSIS — G43E09 Chronic migraine with aura, not intractable, without status migrainosus: Secondary | ICD-10-CM

## 2023-02-26 NOTE — Telephone Encounter (Signed)
Patient Advocate Encounter  Received notification from [facility] that prior authorization for BOTOX 200U is required.   PA submitted on 5.23.24 Key [id number]  Status is pending  PA SUBMITTED VIA FAX TO [facility] FAX: [phone] PHONE: [phone]

## 2023-02-27 ENCOUNTER — Other Ambulatory Visit (HOSPITAL_COMMUNITY): Payer: Self-pay

## 2023-03-03 ENCOUNTER — Other Ambulatory Visit (HOSPITAL_COMMUNITY): Payer: Self-pay

## 2023-03-03 NOTE — Telephone Encounter (Signed)
Pharmacy Patient Advocate Encounter- Botox BIV-Pharmacy Benefit:  PA was submitted to [place] and has been approved through: 5.23.24 TO 11.19.24 Authorization# [id number]  Please send prescription to Specialty Pharmacy: [facility]: [phone]  Estimated Copay is: 4  Patient IS NOT eligible for Botox Copay Card, which will make patient's copay as little as zero. Copay card will be provided to pharmacy.

## 2023-03-04 ENCOUNTER — Other Ambulatory Visit (HOSPITAL_COMMUNITY): Payer: Self-pay

## 2023-03-04 ENCOUNTER — Other Ambulatory Visit: Payer: Self-pay | Admitting: Neurology

## 2023-03-04 ENCOUNTER — Other Ambulatory Visit: Payer: Self-pay

## 2023-03-04 DIAGNOSIS — G43E09 Chronic migraine with aura, not intractable, without status migrainosus: Secondary | ICD-10-CM

## 2023-03-04 MED ORDER — ONABOTULINUMTOXINA 200 UNITS IJ SOLR
200.0000 [IU] | INTRAMUSCULAR | 4 refills | Status: DC
  Filled 2023-03-04: qty 1, 90d supply, fill #0

## 2023-03-04 NOTE — Telephone Encounter (Signed)
I got her on the sch  

## 2023-03-04 NOTE — Telephone Encounter (Signed)
Botox 200 units sent to [facility].  Patient advised of appt and approval. Pharmacy name and number given to patient to call.    Front desk can you schedule appt for 6/14 at 11:30 am.

## 2023-03-05 ENCOUNTER — Other Ambulatory Visit (HOSPITAL_COMMUNITY): Payer: Self-pay

## 2023-03-05 ENCOUNTER — Other Ambulatory Visit: Payer: Self-pay

## 2023-03-05 MED ORDER — ONABOTULINUMTOXINA 200 UNITS IJ SOLR
INTRAMUSCULAR | 4 refills | Status: DC
  Filled 2023-03-05: qty 1, fill #0
  Filled 2023-03-10: qty 1, 84d supply, fill #0
  Filled 2023-06-02: qty 1, 84d supply, fill #1

## 2023-03-06 ENCOUNTER — Other Ambulatory Visit: Payer: Self-pay

## 2023-03-10 ENCOUNTER — Other Ambulatory Visit: Payer: Self-pay

## 2023-03-10 ENCOUNTER — Other Ambulatory Visit (HOSPITAL_COMMUNITY): Payer: Self-pay

## 2023-03-12 ENCOUNTER — Other Ambulatory Visit (HOSPITAL_COMMUNITY): Payer: Self-pay

## 2023-03-13 ENCOUNTER — Other Ambulatory Visit (HOSPITAL_COMMUNITY): Payer: Self-pay

## 2023-03-13 ENCOUNTER — Other Ambulatory Visit: Payer: Self-pay

## 2023-03-20 ENCOUNTER — Ambulatory Visit (INDEPENDENT_AMBULATORY_CARE_PROVIDER_SITE_OTHER): Payer: No Typology Code available for payment source | Admitting: Neurology

## 2023-03-20 DIAGNOSIS — G43E09 Chronic migraine with aura, not intractable, without status migrainosus: Secondary | ICD-10-CM | POA: Diagnosis not present

## 2023-03-20 MED ORDER — ONABOTULINUMTOXINA 100 UNITS IJ SOLR
200.0000 [IU] | Freq: Once | INTRAMUSCULAR | Status: AC
  Administered 2023-03-20: 155 [IU] via INTRAMUSCULAR

## 2023-03-20 NOTE — Progress Notes (Signed)
Botulinum Clinic  ° °Procedure Note Botox ° °Attending: Dr. Adron Geisel ° °Preoperative Diagnosis(es): Chronic migraine ° °Consent obtained from: The patient °Benefits discussed included, but were not limited to decreased muscle tightness, increased joint range of motion, and decreased pain.  Risk discussed included, but were not limited pain and discomfort, bleeding, bruising, excessive weakness, venous thrombosis, muscle atrophy and dysphagia.  Anticipated outcomes of the procedure as well as he risks and benefits of the alternatives to the procedure, and the roles and tasks of the personnel to be involved, were discussed with the patient, and the patient consents to the procedure and agrees to proceed. A copy of the patient medication guide was given to the patient which explains the blackbox warning. ° °Patients identity and treatment sites confirmed Yes.  . ° °Details of Procedure: °Skin was cleaned with alcohol. Prior to injection, the needle plunger was aspirated to make sure the needle was not within a blood vessel.  There was no blood retrieved on aspiration.   ° °Following is a summary of the muscles injected  And the amount of Botulinum toxin used: ° °Dilution °200 units of Botox was reconstituted with 4 ml of preservative free normal saline. °Time of reconstitution: At the time of the office visit (<30 minutes prior to injection)  ° °Injections  °155 total units of Botox was injected with a 30 gauge needle. ° °Injection Sites: °L occipitalis: 15 units- 3 sites  °R occiptalis: 15 units- 3 sites ° °L upper trapezius: 15 units- 3 sites °R upper trapezius: 15 units- 3 sits          °L paraspinal: 10 units- 2 sites °R paraspinal: 10 units- 2 sites ° °Face °L frontalis(2 injection sites):10 units   °R frontalis(2 injection sites):10 units         °L corrugator: 5 units   °R corrugator: 5 units           °Procerus: 5 units   °L temporalis: 20 units °R temporalis: 20 units  ° °Agent:  °200 units of botulinum Type  A (Onobotulinum Toxin type A) was reconstituted with 4 ml of preservative free normal saline.  °Time of reconstitution: At the time of the office visit (<30 minutes prior to injection)  ° ° ° Total injected (Units):  155 ° Total wasted (Units):  45 ° °Patient tolerated procedure well without complications.   °Reinjection is anticipated in 3 months. ° ° °

## 2023-03-23 ENCOUNTER — Ambulatory Visit: Payer: No Typology Code available for payment source | Admitting: Obstetrics and Gynecology

## 2023-03-23 ENCOUNTER — Encounter: Payer: Self-pay | Admitting: Obstetrics and Gynecology

## 2023-03-23 ENCOUNTER — Other Ambulatory Visit (HOSPITAL_COMMUNITY)
Admission: RE | Admit: 2023-03-23 | Discharge: 2023-03-23 | Disposition: A | Discharge status: Home / Self Care | Payer: No Typology Code available for payment source | Source: Ambulatory Visit | Attending: Obstetrics and Gynecology | Admitting: Obstetrics and Gynecology

## 2023-03-23 VITALS — BP 133/91 | HR 82 | Ht 60.0 in | Wt 187.0 lb

## 2023-03-23 DIAGNOSIS — Z202 Contact with and (suspected) exposure to infections with a predominantly sexual mode of transmission: Secondary | ICD-10-CM

## 2023-03-23 DIAGNOSIS — Z01419 Encounter for gynecological examination (general) (routine) without abnormal findings: Secondary | ICD-10-CM | POA: Insufficient documentation

## 2023-03-23 DIAGNOSIS — Z975 Presence of (intrauterine) contraceptive device: Secondary | ICD-10-CM

## 2023-03-23 HISTORY — DX: Presence of (intrauterine) contraceptive device: Z97.5

## 2023-03-23 NOTE — Progress Notes (Signed)
Barbara Koch is a 37 y.o. G74P1020 female here for a routine annual gynecologic exam.  Current complaints: desires for STD testing.   Denies abnormal vaginal bleeding, discharge, pelvic pain, problems with intercourse or other gynecologic concerns.    Gynecologic History No LMP recorded. Patient has had an implant. Contraception: Nexplanon Last Pap: uncertain. Results were: abnormal Last mammogram: NA.   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     cloNIDine (CATAPRES) 0.1 MG tablet TAKE 1 TABLET BY MOUTH AT BEDTIME FOR SLEEP     DULoxetine (CYMBALTA) 60 MG capsule Take 120 mg by mouth daily.     etonogestrel (NEXPLANON) 68 MG IMPL implant 1 each by Subdermal route once. 05/2013     gabapentin (NEURONTIN) 300 MG capsule 600 mg 2 (two) times daily. 600 mg in the morning, and 1200 at bedtime     lisdexamfetamine (VYVANSE) 30 MG capsule Take 30 mg by mouth daily.     No current facility-administered medications on file prior to visit.    Allergies  Allergen Reactions   Celebrex [Celecoxib] Hives    Can take other NSAIDs   Sulfa Antibiotics     Hives     Social  History   Socioeconomic History   Marital status: Single    Spouse name: Not on file   Number of children: Not on file   Years of education: Not on file   Highest education level: Not on file  Occupational History   Not on file  Tobacco Use   Smoking status: Every Day    Packs/day: 1    Types: Cigarettes   Smokeless tobacco: Never  Vaping Use   Vaping Use: Some days  Substance and Sexual Activity   Alcohol use: Not Currently    Comment: occas   Drug use: Yes    Types: Marijuana    Comment: occ   Sexual activity: Yes    Birth control/protection: Implant  Other Topics Concern   Not on file  Social History Narrative   Are you right handed or left handed? Right   Are you currently employed ? NO   What is your current occupation?   Do you live at home alone?   Who lives with you?    What type  of home do you live in: 1 story or 2 story? 1       Social Determinants of Health   Financial Resource Strain: High Risk (03/23/2023)   Overall Financial Resource Strain (CARDIA)    Difficulty of Paying Living Expenses: Very hard  Food Insecurity: Food Insecurity Present (03/23/2023)   Hunger Vital Sign    Worried About Running Out of Food in the Last Year: Sometimes true    Ran Out of Food in the Last Year: Sometimes true  Transportation Needs: Unknown (03/23/2023)   PRAPARE - Administrator, Civil Service (Medical): Not on file    Lack of Transportation (Non-Medical): No  Physical Activity: Insufficiently Active (03/23/2023)   Exercise Vital Sign    Days of Exercise per Week: 3 days    Minutes of Exercise per Session: 20 min  Stress: Stress Concern Present (03/23/2023)   [employer] of Occupational Health - Occupational Stress Questionnaire    Feeling of Stress : Very much  Social Connections: Socially Isolated (03/23/2023)   Social Connection and Isolation Panel [NHANES]    Frequency of Communication with Friends and Family: Twice a week    Frequency of Social  Gatherings with Friends and Family: Once a week    Attends Religious Services: Never    Database administrator or Organizations: No    Attends Banker Meetings: Never    Marital Status: Divorced  Catering manager Violence: Not At Risk (03/23/2023)   Humiliation, Afraid, Rape, and Kick questionnaire    Fear of Current or Ex-Partner: No    Emotionally Abused: No    Physically Abused: No    Sexually Abused: No    Family History  Problem Relation Age of Onset   Migraines Mother    Depression Mother    Cancer Father    Migraines Maternal Aunt    Migraines Maternal Grandmother    Cancer Other    Diabetes Other     The following portions of the patient's history were reviewed and updated as appropriate: allergies, current medications, past family history, past medical history, past social history, past surgical history and problem list.  Review of Systems Pertinent items noted in HPI and remainder of comprehensive ROS otherwise negative.   Objective:  BP (!) 133/91   Pulse 82   Ht 5' (1.524 m)   Wt 187 lb (84.8 kg)   BMI 36.52 kg/m  Chaperone present CONSTITUTIONAL: Well-developed, well-nourished female in no acute distress.  HENT:  Normocephalic, atraumatic, External right and left ear normal. Oropharynx is clear and moist EYES: Conjunctivae and EOM are normal. Pupils are equal, round, and reactive to light. No scleral icterus.  NECK: Normal range of motion, supple, no masses.  Normal thyroid.  SKIN: Skin is warm and dry. No rash noted. Not diaphoretic. No erythema. No pallor. NEUROLGIC: Alert and oriented to person, place, and time. Normal reflexes, muscle tone coordination. No cranial nerve deficit noted. PSYCHIATRIC: Normal mood and affect. Normal behavior. Normal judgment and thought content. CARDIOVASCULAR: Normal heart rate noted, regular rhythm RESPIRATORY: Clear to auscultation bilaterally. Effort and breath sounds normal, no problems with respiration  noted. BREASTS: Symmetric in size. No masses, skin changes, nipple drainage, or lymphadenopathy. ABDOMEN: Soft, normal bowel sounds, no distention noted.  No tenderness, rebound or guarding.  PELVIC: Normal appearing external genitalia; normal appearing vaginal mucosa and cervix.  No abnormal discharge noted.  Pap smear obtained.  Normal uterine size, no other palpable masses, no uterine or  adnexal tenderness. MUSCULOSKELETAL: Normal range of motion. No tenderness.  No cyanosis, clubbing, or edema.  2+ distal pulses.   Assessment:  Annual gynecologic examination with pap smear  STD exposure Plan:  Will follow up results of pap smear and manage accordingly. STD testing as per pt request Pt to check card for date of insertion and when removal is needed Routine preventative health maintenance measures emphasized. Please refer to After Visit Summary for other counseling recommendations.    Hermina Staggers, MD, FACOG Attending Obstetrician & Gynecologist Center for [facility], [facility]

## 2023-03-24 LAB — HEPATITIS B SURFACE ANTIGEN: Hepatitis B Surface Ag: NEGATIVE

## 2023-03-24 LAB — HIV ANTIBODY (ROUTINE TESTING W REFLEX): HIV Screen 4th Generation wRfx: NONREACTIVE

## 2023-03-24 LAB — HEPATITIS C ANTIBODY: Hep C Virus Ab: NONREACTIVE

## 2023-03-24 LAB — RPR: RPR Ser Ql: NONREACTIVE

## 2023-03-31 LAB — CYTOLOGY - PAP
Chlamydia: NEGATIVE
Comment: NEGATIVE
Comment: NEGATIVE
Comment: NEGATIVE
Comment: NEGATIVE
Comment: NORMAL
Diagnosis: UNDETERMINED — AB
HPV 16: NEGATIVE
HPV 18 / 45: NEGATIVE
High risk HPV: POSITIVE — AB
Neisseria Gonorrhea: NEGATIVE

## 2023-04-06 ENCOUNTER — Telehealth: Payer: Self-pay | Admitting: *Deleted

## 2023-04-06 NOTE — Telephone Encounter (Signed)
LMOVM for patient to return call regarding pap smear results.

## 2023-04-07 ENCOUNTER — Other Ambulatory Visit (HOSPITAL_COMMUNITY): Payer: Self-pay | Admitting: Internal Medicine

## 2023-04-07 DIAGNOSIS — M542 Cervicalgia: Secondary | ICD-10-CM

## 2023-04-15 ENCOUNTER — Telehealth: Payer: Self-pay | Admitting: *Deleted

## 2023-04-15 NOTE — Telephone Encounter (Signed)
Called patient to inform of abnormal pap results. States this is her normal but understands she needs a colpo.  Appt scheduled with no further questions.

## 2023-05-04 ENCOUNTER — Ambulatory Visit (HOSPITAL_COMMUNITY)
Admission: RE | Admit: 2023-05-04 | Discharge: 2023-05-04 | Disposition: A | Discharge status: Home / Self Care | Payer: No Typology Code available for payment source | Source: Ambulatory Visit | Attending: Internal Medicine | Admitting: Internal Medicine

## 2023-05-04 DIAGNOSIS — M542 Cervicalgia: Secondary | ICD-10-CM | POA: Insufficient documentation

## 2023-05-11 ENCOUNTER — Encounter: Payer: Self-pay | Admitting: Obstetrics & Gynecology

## 2023-05-11 ENCOUNTER — Ambulatory Visit (INDEPENDENT_AMBULATORY_CARE_PROVIDER_SITE_OTHER): Payer: No Typology Code available for payment source | Admitting: Obstetrics & Gynecology

## 2023-05-11 ENCOUNTER — Other Ambulatory Visit (HOSPITAL_COMMUNITY)
Admission: RE | Admit: 2023-05-11 | Discharge: 2023-05-11 | Disposition: A | Discharge status: Home / Self Care | Payer: No Typology Code available for payment source | Source: Ambulatory Visit | Attending: Obstetrics & Gynecology | Admitting: Obstetrics & Gynecology

## 2023-05-11 VITALS — BP 145/92 | HR 93 | Ht 60.0 in | Wt 189.0 lb

## 2023-05-11 DIAGNOSIS — Z3202 Encounter for pregnancy test, result negative: Secondary | ICD-10-CM

## 2023-05-11 DIAGNOSIS — Z113 Encounter for screening for infections with a predominantly sexual mode of transmission: Secondary | ICD-10-CM | POA: Diagnosis not present

## 2023-05-11 DIAGNOSIS — R87810 Cervical high risk human papillomavirus (HPV) DNA test positive: Secondary | ICD-10-CM | POA: Diagnosis not present

## 2023-05-11 DIAGNOSIS — N939 Abnormal uterine and vaginal bleeding, unspecified: Secondary | ICD-10-CM | POA: Diagnosis not present

## 2023-05-11 DIAGNOSIS — Z975 Presence of (intrauterine) contraceptive device: Secondary | ICD-10-CM

## 2023-05-11 LAB — POCT URINE PREGNANCY: Preg Test, Ur: NEGATIVE

## 2023-05-11 NOTE — Progress Notes (Signed)
    Patient name: Barbara Koch MRN [id number]  Date of birth: 04/01/1986 Chief Complaint:   Colposcopy  History of Present Illness:   Barbara Koch is a 37 y.o. G68P1020 female being seen today for cervical dysplasia management.  Smoker:  Yes.   New sexual partner:  No.   Prior cytology:  Date Result Procedure  03/2023 NILM w/ HRHPV positive: other (not 16, 18/45)    01/2022 ASCUS w/ HRHPV positive: other (not 16, 18/45) Colposcopy: CIN 1   Using Nexplanon for contraception.  She has noted some irregular spotting and wishes to be checked for an infection.  The spotting started only a few days ago.  She reports no other acute complaints     03/23/2023    3:17 PM  Depression screen PHQ 2/9  Decreased Interest 3  Down, Depressed, Hopeless 3  PHQ - 2 Score 6  Altered sleeping 3  Tired, decreased energy 3  Change in appetite 3  Feeling bad or failure about yourself  3  Trouble concentrating 3  Moving slowly or fidgety/restless 3  Suicidal thoughts 1  PHQ-9 Score 25     Review of Systems:   Pertinent items are noted in HPI Denies fever/chills, dizziness, headaches, visual disturbances, fatigue, shortness of breath, chest pain, abdominal pain, vomiting, no problems with periods, bowel movements, urination, or intercourse unless otherwise stated above.  Pertinent History Reviewed:  Reviewed past medical,surgical, social, obstetrical and family history.  Reviewed problem list, medications and allergies. Physical Assessment:   Vitals:   05/11/23 1507  BP: (!) 145/92  Pulse: 93  Weight: 189 lb (85.7 kg)  Height: 5' (1.524 m)  Body mass index is 36.91 kg/m.       Physical Examination:   General appearance: alert, well appearing, and in no distress  Psych: mood appropriate, normal affect  Skin: warm & dry   Cardiovascular: normal heart rate noted  Respiratory: normal respiratory effort, no distress  Abdomen: soft, non-tender   Pelvic: VULVA: normal appearing vulva with no  masses, tenderness or lesions, VAGINA: normal appearing vagina with normal color and discharge, no lesions, CERVIX: see colposcopy section  Extremities: no edema   Chaperone: Faith Rogue     Colposcopy Procedure Note  Indications: Pap normal, HPV+ other    Procedure Details  The risks and benefits of the procedure and Written informed consent obtained.  Speculum placed in vagina and excellent visualization of cervix achieved, cervix swabbed x 3 with acetic acid solution.  Findings: Adequate colposcopy is noted today.  TMZ zone seen  Cervix: acetowhite lesion(s) noted at 6-8 o'clock; ECC and cervical biopsies obtained.    Monsel's applied.  Adequate hemostasis noted  Specimens: ECC and cervical biopsies  Complications: none.  Colposcopic Impression: CIN 1   Plan(Based on 2019 ASCCP recommendations)  -Pt has had colposcopy in the past -Reviewed that recent Pap shows improvement from prior dysplasia  -Again discussed HPV and degree of abnormal pap smears  -Discussed ASCCP guidelines and current recommendations for colposcopy -As above, inform consent obtained and procedure completed -biopsies obtained, further management pending results -Questions and concerns were addressed  -Vaginitis panel obtained- further management pending results  Myna Hidalgo, DO Attending Obstetrician & Gynecologist, [facility] Center for [employer], [facility]

## 2023-05-13 ENCOUNTER — Other Ambulatory Visit: Payer: Self-pay | Admitting: Obstetrics & Gynecology

## 2023-05-13 DIAGNOSIS — B9689 Other specified bacterial agents as the cause of diseases classified elsewhere: Secondary | ICD-10-CM

## 2023-05-13 MED ORDER — METRONIDAZOLE 500 MG PO TABS: 500.0000 mg | ORAL_TABLET | Freq: Two times a day (BID) | ORAL | 0 refills | Status: DC

## 2023-05-13 NOTE — Progress Notes (Signed)
Rx for BV treatment- Flagyl  Barbara Hidalgo, DO Attending Obstetrician & Gynecologist, [facility] for [employer], [facility]

## 2023-05-28 ENCOUNTER — Telehealth: Payer: Self-pay | Admitting: Neurology

## 2023-05-28 NOTE — Telephone Encounter (Signed)
Pt called crying, stated her migraines progressed. She feels weak, can't drive, no energy for ger child. Stated the migraine is the worst she's ever had and she needs help as soon as possible.

## 2023-05-28 NOTE — Telephone Encounter (Signed)
Per patient she had Qulipta on hand and took it about an hour ago.  Patient states she did not know that Bennie Pierini is daily.  Advised patient Bennie Pierini is a daily medication and since it is not in her last note to take I can not approve for her to take it yet.  Will discuss with Dr.Jaffe and let her know   Nurtec she has samples from her last visit.  Advised patient Nurtec as needed she needs to be used only once every 24 hours as soon as she feels one come on. Please do not wait and see if the migraine goes away then take it.    Patient take Cymbalta 120 mg daily for Depression, and fibromyalgia.   Advised patient if she takes the Nurtec today and that doesn't work please give [place] a call.    Patient wanted to know if we could ask Dr.Jaffe to getting a headache cocktail or prednisone taper to the pharmacy.

## 2023-05-29 ENCOUNTER — Telehealth: Payer: Self-pay

## 2023-05-29 MED ORDER — PREDNISONE 10 MG (21) PO TBPK: ORAL_TABLET | ORAL | 0 refills | Status: DC

## 2023-05-29 NOTE — Telephone Encounter (Signed)
Patient advised via mychart per [facility], She may come in for a migraine cocktail if she has a driver.  At the same time, I would give her a print out script for prednisone taper to fill tomorrow if the migraine doesn't resolve.

## 2023-06-01 ENCOUNTER — Encounter: Payer: Self-pay | Admitting: *Deleted

## 2023-06-02 ENCOUNTER — Other Ambulatory Visit (HOSPITAL_COMMUNITY): Payer: Self-pay

## 2023-06-02 ENCOUNTER — Telehealth: Payer: Self-pay | Admitting: Obstetrics & Gynecology

## 2023-06-02 NOTE — Telephone Encounter (Signed)
Called pt regarding her mychart message.  She notes some dark brown spotting.  Normally she does not have a period with this form of contraception.  She denies itching or irritation.  She did complete Flagyl as prescribed for BV.  Of note, it has been >2wks since colposcopy.  Reassured pt that I did not think this spotting was something concerning.  Discussed that sometimes stress can cause some irregular bleeding (recent migraine).  Plan to monitor symptoms and f/u should she note worsening of her symptoms.  Myna Hidalgo, DO Attending Obstetrician & Gynecologist, [facility] for [employer], [facility]

## 2023-06-10 ENCOUNTER — Other Ambulatory Visit (HOSPITAL_COMMUNITY): Payer: Self-pay

## 2023-06-10 ENCOUNTER — Other Ambulatory Visit: Payer: Self-pay

## 2023-06-19 ENCOUNTER — Ambulatory Visit (INDEPENDENT_AMBULATORY_CARE_PROVIDER_SITE_OTHER): Payer: No Typology Code available for payment source | Admitting: Neurology

## 2023-06-19 ENCOUNTER — Encounter: Payer: Self-pay | Admitting: Neurology

## 2023-06-19 DIAGNOSIS — G43E09 Chronic migraine with aura, not intractable, without status migrainosus: Secondary | ICD-10-CM | POA: Diagnosis not present

## 2023-06-19 NOTE — Progress Notes (Signed)
Patient rescheduled appt. After Barbara Koch rash heals.   Sample to be mailed for patient due to her Speciality Botox was drawn up for her appt today.

## 2023-06-19 NOTE — Progress Notes (Signed)
Patient has poison ivy.  Involving back of her neck.  I do not know how much may be involved on her scalp under her hair.  We have decided to reschedule Botox until rash is appropriately treated.

## 2023-07-17 ENCOUNTER — Ambulatory Visit (INDEPENDENT_AMBULATORY_CARE_PROVIDER_SITE_OTHER): Payer: No Typology Code available for payment source | Admitting: Neurology

## 2023-07-17 DIAGNOSIS — G43E09 Chronic migraine with aura, not intractable, without status migrainosus: Secondary | ICD-10-CM | POA: Diagnosis not present

## 2023-07-17 MED ORDER — ONABOTULINUMTOXINA 100 UNITS IJ SOLR
200.0000 [IU] | Freq: Once | INTRAMUSCULAR | Status: AC
  Administered 2023-07-17: 155 [IU] via INTRAMUSCULAR

## 2023-07-17 NOTE — Progress Notes (Signed)

## 2023-07-20 ENCOUNTER — Encounter: Payer: Self-pay | Admitting: Neurology

## 2023-08-11 ENCOUNTER — Encounter (INDEPENDENT_AMBULATORY_CARE_PROVIDER_SITE_OTHER): Payer: Self-pay

## 2023-08-11 NOTE — Progress Notes (Deleted)
NEUROLOGY FOLLOW UP OFFICE NOTE  Barbara Koch [id number]  Assessment/Plan:   Chronic migraine with aura, without status migrainosus, not intractable - at least 15 headache days a month for over 3 consecutive months - failed multiple preventative medications.  Migraine prevention:  Plan to restart Botox Migraine rescue:  She will try samples of Nurtec as needed. Limit use of pain relievers to no more than 2 days out of week to prevent risk of rebound or medication-overuse headache. Keep headache diary Follow up for Botox    Subjective:  Barbara Koch is a 37 year old female with ADHD, PTSD, rheumatoid arthritis, chronic pain, depression and anxiety who follows up for migraines.  UPDATE: Status post 2 rounds of Botox.  Second round was delayed as patient had poison ivy over her scalp and back of her neck.    Intensity:  *** Duration:  *** with Zavzpret NS (samples) Frequency:  ***  Current NSAIDS/analgesics:  ibuprofen Current triptans:  none Current ergotamine:  none Current anti-emetic:  none Current muscle relaxants:  none Current Antihypertensive medications:  clonidine (for sleep) Current Antidepressant medications:  duloxetine 120mg  daily Current Anticonvulsant medications:  gabapentin 600mg  AM and 1200mg  QHS Current anti-CGRP:  none Current Vitamins/Herbal/Supplements:  none Current Antihistamines/Decongestants:  loratidine Other therapy:  none Birth control:  Nexplanon Other medications:  clonazepam, Vyvanse   Caffeine:  daily soft drinks, rarely coffee Depression:  yes; Anxiety:  yes Other pain:  fibromyalgia  HISTORY: She has a persistent 3/10 daily headache  Onset:  teenager Location:  temples, occipital bilateral Quality:  pressure, throbbing towards end of day Intensity:  3/10 (baseline), 7/10 when severe   Aura:  floaters, dark peripheral vision Prodrome:  absent Associated symptoms:  photophobia, phonophobia, osmophobia.  Mild nausea.  She  denies associated vomiting,unilateral numbness or weakness. Duration:  3-4 hours Frequency:  4 days a week Frequency of abortive medication: ibuprofen once every 2 weeks Triggers:  seasonal, MSG, cheese Relieving factors:  rest in dark and quiet room, cold pack, sleep Activity:  aggravates  Past NSAIDS/analgesics:  Anacin, BC, Goody's, Tylenol #3, Fioricet, hydrocodone, Cambia, Celebrex (adverse reaction), naproxen Past abortive triptans:  sumatriptan tab/inj, rizatriptan, eletriptan, zolmitriptan Past abortive ergotamine:  none Past muscle relaxants:  Flexeril, baclofen, Robaxin, Skelaxin, tizanidine Past anti-emetic:  Zofran Past antihypertensive medications:  none Past antidepressant medications:  amitriptyline, venlafaxine, mirtazapine, sertraline, fluoxetine, imipramine Past anticonvulsant medications:  topiramate Past anti-CGRP:  Aimovig (injection site reaction), Bernita Raisin, Nurtec Past vitamins/Herbal/Supplements:  magnesium, riboflavin Past antihistamines/decongestants:  Claritin, Zyrtec, Sudafed Other past therapies:  Botox (s/p 1 injection - practice closed but noticed improvement), trigger point injections, prednisone taper   Family history of headache:  mother (migraines)  CT head on 06/18/2021 personally reviewed was normal.   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     cloNIDine (CATAPRES) 0.1 MG tablet TAKE 1 TABLET BY MOUTH AT BEDTIME FOR SLEEP     DULoxetine (CYMBALTA) 60 MG capsule Take 120 mg by mouth daily.     etonogestrel (NEXPLANON) 68 MG IMPL implant 1 each by Subdermal route once. 05/2013     gabapentin (NEURONTIN) 600 MG tablet Take 1,800 mg by mouth daily.     lisdexamfetamine (VYVANSE) 30 MG capsule Take 30 mg by mouth daily.     metroNIDAZOLE (FLAGYL) 500 MG tablet Take 1 tablet (500 mg total) by mouth 2 (two) times daily. 14 tablet 0   predniSONE (STERAPRED UNI-PAK 21 TAB) 10 MG (21) TBPK tablet take 60mg  day 1, then 50mg  day 2, then 40mg  day 3, then 30mg  day 4, then 20mg  day 5, then 10mg  day 6, then STOP 21 tablet 0   No current facility-administered medications on file prior to visit.    ALLERGIES: Allergies  Allergen Reactions   Celebrex [Celecoxib] Hives    Can take other NSAIDs   Sulfa Antibiotics     Hives     FAMILY HISTORY: Family History  Problem Relation Age of Onset   Migraines Mother    Depression Mother    Cancer Father    Migraines Maternal Aunt    Migraines Maternal Grandmother    Cancer Other    Diabetes Other       Objective:  *** General: No acute distress.  Patient appears ***-groomed.   Head:  Normocephalic/atraumatic Eyes:  Fundi examined but not visualized Neck: supple, no paraspinal tenderness, full range of motion Heart:  Regular rate and rhythm Lungs:  Clear to auscultation bilaterally Back: No paraspinal tenderness Neurological Exam: alert and oriented.  Speech fluent and not dysarthric, language intact.  CN II-XII intact. Bulk and tone normal, muscle strength 5/5 throughout.  Sensation to light touch intact.  Deep tendon reflexes 2+ throughout, toes downgoing.  Finger to nose testing intact.  Gait normal, Romberg negative.   Shon Millet, DO  CC:  ***

## 2023-08-12 ENCOUNTER — Ambulatory Visit: Payer: No Typology Code available for payment source | Admitting: Neurology

## 2023-08-12 ENCOUNTER — Telehealth: Payer: Self-pay

## 2023-08-12 NOTE — Telephone Encounter (Signed)
[facility] referral [facility]ed last referral expired 07/22/23

## 2023-09-18 ENCOUNTER — Ambulatory Visit: Payer: No Typology Code available for payment source | Admitting: Neurology

## 2023-09-29 NOTE — Telephone Encounter (Signed)
As per her request, I would like to stop Botox and start Vyepti 300mg  infusion every 3 months.

## 2023-10-01 ENCOUNTER — Other Ambulatory Visit: Payer: Self-pay

## 2023-10-01 ENCOUNTER — Telehealth: Payer: Self-pay | Admitting: Pharmacy Technician

## 2023-10-01 ENCOUNTER — Telehealth: Payer: Self-pay

## 2023-10-01 DIAGNOSIS — G43E09 Chronic migraine with aura, not intractable, without status migrainosus: Secondary | ICD-10-CM | POA: Insufficient documentation

## 2023-10-01 NOTE — Telephone Encounter (Signed)
Per Dr.Jaffe, As per her request, I would like to stop Botox and start Vyepti 300mg  infusion every 3 months.    Botox cancelled with [facility].    Vyepti ordered and sent to the Team at [facility] infusion center at [facility].

## 2023-10-01 NOTE — Telephone Encounter (Signed)
Barbara Koch,  Patient will be scheduled after 10/23/23 per Berkley Harvey approval date.  Auth Submission: APPROVED Site of care: Site of care: [facility]ayer: [facility] - community of care Medication & CPT/J Code(s) submitted: Vyepti (Eptinezumab) [phone] Route of submission (phone, fax, portal):  Phone # Fax # Auth type: Buy/Bill PB Units/visits requested: 300mg  q3 months Reference number:[id number] Approval from: 10/23/23 to 04/20/24

## 2023-10-06 ENCOUNTER — Other Ambulatory Visit: Payer: Self-pay

## 2023-10-12 ENCOUNTER — Other Ambulatory Visit (HOSPITAL_COMMUNITY): Payer: Self-pay

## 2023-10-12 ENCOUNTER — Other Ambulatory Visit (HOSPITAL_COMMUNITY): Payer: Self-pay | Admitting: Pharmacy Technician

## 2023-10-12 NOTE — Progress Notes (Signed)
 Disenrolled; Patient switching to [facility]. & looks like will be doing Buy/Bill

## 2023-10-13 ENCOUNTER — Telehealth: Payer: Self-pay

## 2023-10-13 NOTE — Telephone Encounter (Signed)
 Auth Submission: APPROVED Site of care: Site of care: [facility]ayer: [facility] - community of care Medication & CPT/J Code(s) submitted: Vyepti (Eptinezumab) [phone] Route of submission (phone, fax, portal):  Phone # Fax # Auth type: Buy/Bill PB Units/visits requested: 300mg  q3 months Reference number:[id number] Approval from: 10/23/23 to 04/20/24

## 2023-10-23 ENCOUNTER — Ambulatory Visit: Payer: No Typology Code available for payment source | Admitting: Neurology

## 2023-10-26 ENCOUNTER — Other Ambulatory Visit: Payer: Self-pay | Admitting: Emergency Medicine

## 2023-10-28 ENCOUNTER — Ambulatory Visit: Payer: No Typology Code available for payment source

## 2023-11-17 NOTE — Progress Notes (Deleted)
 NEUROLOGY FOLLOW UP OFFICE NOTE  Barbara Koch [id number]  Assessment/Plan:   Chronic migraine with aura, without status migrainosus, not intractable  Migraine prevention:  Discontinue Botox.  Plan to start Vyepti *** Migraine rescue:  She will try samples of Nurtec as needed.*** Limit use of pain relievers to no more than 2 days out of week to prevent risk of rebound or medication-overuse headache. Keep headache diary Follow up for Botox    Subjective:  Barbara Koch is a 38 year old female with ADHD, PTSD, rheumatoid arthritis, chronic pain, depression and anxiety who follows up for migraines.  UPDATE: Status post 2 rounds of Botox.  Nurtec *** She would like to discontinue Botox and try Vyepti Frequency of abortive medication: *** Current NSAIDS/analgesics:  ibuprofen Current triptans:  none Current ergotamine:  none Current anti-emetic:  none Current muscle relaxants:  none Current Antihypertensive medications:  clonidine (for sleep) Current Antidepressant medications:  duloxetine 120mg  daily Current Anticonvulsant medications:  gabapentin 600mg  AM and 1200mg  QHS Current anti-CGRP:  none Current Vitamins/Herbal/Supplements:  none Current Antihistamines/Decongestants:  loratidine Other therapy:  Botox Birth control:  Nexplanon Other medications:  clonazepam, Vyvanse   Caffeine:  daily soft drinks, rarely coffee Depression:  yes; Anxiety:  yes Other pain:  fibromyalgia  HISTORY: She has a persistent 3/10 daily headache  Onset:  teenager Location:  temples, occipital bilateral Quality:  pressure, throbbing towards end of day Intensity:  3/10 (baseline), 7/10 when severe   Aura:  floaters, dark peripheral vision Prodrome:  absent Associated symptoms:  photophobia, phonophobia, osmophobia.  Mild nausea.  She denies associated vomiting,unilateral numbness or weakness. Duration:  3-4 hours Frequency:  4 days a week Frequency of abortive medication: ibuprofen  once every 2 weeks Triggers:  seasonal, MSG, cheese Relieving factors:  rest in dark and quiet room, cold pack, sleep Activity:  aggravates  Past NSAIDS/analgesics:  Anacin, BC, Goody's, Tylenol #3, Fioricet, hydrocodone, Cambia, Celebrex (adverse reaction), naproxen Past abortive triptans:  sumatriptan tab/inj, rizatriptan, eletriptan, zolmitriptan Past abortive ergotamine:  none Past muscle relaxants:  Flexeril, baclofen, Robaxin, Skelaxin, tizanidine Past anti-emetic:  Zofran Past antihypertensive medications:  none Past antidepressant medications:  amitriptyline, venlafaxine, mirtazapine, sertraline, fluoxetine, imipramine Past anticonvulsant medications:  topiramate Past anti-CGRP:  Aimovig (injection site reaction), Bernita Raisin Past vitamins/Herbal/Supplements:  magnesium, riboflavin Past antihistamines/decongestants:  Claritin, Zyrtec, Sudafed Other past therapies:  trigger point injections, prednisone taper   Family history of headache:  mother (migraines)  CT head on 06/18/2021 was normal.   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     cloNIDine (CATAPRES) 0.1 MG tablet TAKE 1 TABLET BY MOUTH AT BEDTIME FOR SLEEP     DULoxetine (CYMBALTA) 60 MG capsule Take 120 mg by mouth daily.     etonogestrel (NEXPLANON) 68 MG IMPL implant 1 each by Subdermal route once. 05/2013  gabapentin  (NEURONTIN) 600 MG tablet Take 1,800 mg by mouth daily.     lisdexamfetamine (VYVANSE) 30 MG capsule Take 30 mg by mouth daily.     metroNIDAZOLE (FLAGYL) 500 MG tablet Take 1 tablet (500 mg total) by mouth 2 (two) times daily. 14 tablet 0   predniSONE (STERAPRED UNI-PAK 21 TAB) 10 MG (21) TBPK tablet take 60mg  day 1, then 50mg  day 2, then 40mg  day 3, then 30mg  day 4, then 20mg  day 5, then 10mg  day 6, then STOP 21 tablet 0   No current facility-administered medications on file prior to visit.    ALLERGIES: Allergies  Allergen Reactions   Celebrex [Celecoxib] Hives    Can take other NSAIDs   Sulfa Antibiotics     Hives     FAMILY HISTORY: Family History  Problem Relation Age of Onset   Migraines Mother    Depression Mother    Cancer Father    Migraines Maternal Aunt    Migraines Maternal Grandmother    Cancer Other    Diabetes Other       Objective:  *** General: No acute distress.  Patient appears ***-groomed.   Head:  Normocephalic/atraumatic Eyes:  Fundi examined but not visualized Neck: supple, no paraspinal tenderness, full range of motion Heart:  Regular rate and rhythm Lungs:  Clear to auscultation bilaterally Back: No paraspinal tenderness Neurological Exam: alert and oriented.  Speech fluent and not dysarthric, language intact.  CN II-XII intact. Bulk and tone normal, muscle strength 5/5 throughout.  Sensation to light touch intact.  Deep tendon reflexes 2+ throughout, toes downgoing.  Finger to nose testing intact.  Gait normal, Romberg negative.   Shon Millet, DO  CC: ***

## 2023-11-18 ENCOUNTER — Ambulatory Visit: Payer: No Typology Code available for payment source | Admitting: Neurology

## 2023-12-08 ENCOUNTER — Ambulatory Visit: Admitting: Physician Assistant

## 2023-12-28 ENCOUNTER — Other Ambulatory Visit: Payer: Self-pay | Admitting: Medical Genetics

## 2023-12-28 ENCOUNTER — Ambulatory Visit: Admitting: Physician Assistant

## 2023-12-31 ENCOUNTER — Other Ambulatory Visit: Payer: Self-pay

## 2023-12-31 ENCOUNTER — Ambulatory Visit (INDEPENDENT_AMBULATORY_CARE_PROVIDER_SITE_OTHER)

## 2023-12-31 ENCOUNTER — Ambulatory Visit
Admission: RE | Admit: 2023-12-31 | Discharge: 2023-12-31 | Disposition: A | Discharge status: Home / Self Care | Source: Ambulatory Visit | Attending: Family Medicine | Admitting: Family Medicine

## 2023-12-31 VITALS — BP 137/93 | HR 113 | Temp 98.6°F | Resp 20

## 2023-12-31 DIAGNOSIS — N760 Acute vaginitis: Secondary | ICD-10-CM | POA: Insufficient documentation

## 2023-12-31 DIAGNOSIS — M79641 Pain in right hand: Secondary | ICD-10-CM | POA: Diagnosis present

## 2023-12-31 DIAGNOSIS — L600 Ingrowing nail: Secondary | ICD-10-CM | POA: Diagnosis present

## 2023-12-31 MED ORDER — DEXAMETHASONE SODIUM PHOSPHATE 10 MG/ML IJ SOLN
10.0000 mg | Freq: Once | INTRAMUSCULAR | Status: AC
  Administered 2023-12-31: 10 mg via INTRAMUSCULAR

## 2023-12-31 MED ORDER — FLUCONAZOLE 150 MG PO TABS: 150.0000 mg | ORAL_TABLET | Freq: Every day | ORAL | 0 refills | Status: DC

## 2023-12-31 NOTE — ED Provider Notes (Signed)
 [facility]    CSN: [id number] Arrival date & time: 12/31/23  1306      History   Chief Complaint Chief Complaint  Patient presents with   Hand Problem    Right hand is in pain and has limited mobility.  Middle finger is swollen at base and knuckle. Pain is 4-7, depending on movement.Would also like to have a sti panel please.  Symptoms similar to BV. - Entered by patient    HPI Barbara Koch is a 38 y.o. female.   Patient presenting today with 1 day history of right hand pain and swelling at the third finger and into the hand in this area.  Denies known injury, discoloration or bruising, numbness, tingling but does have stiffness with range of motion.  So for now try anything over-the-counter for symptoms.  States history of rheumatoid arthritis not currently on any medication for this.  She is also complaining of vaginal discharge and itching, history of BV recently finished antibiotics earlier this month for this.  Denies rashes or lesions, concern for STIs and is not sexually active.  No concern for pregnancy at this time.  Not tried anything over-the-counter for this.  She is also complaining of an ongoing issue with an ingrown toenail to the right great toe.  Has been keeping it covered and clean, denies drainage, redness, swelling, numbness, tingling.    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   Rheumatoid arthritis [facility])     Patient Active Problem List   Diagnosis Date Noted   Chronic migraine with aura without status migrainosus, not intractable 10/01/2023   Visit for routine gyn exam 03/23/2023   STD exposure 03/23/2023   Nexplanon in place 03/23/2023    Past Surgical History:  Procedure Laterality Date   dislocated shoulder     right    OB History     Gravida  3   Para  1   Term  1   Preterm      AB  2    Living         SAB      IAB  2   Ectopic      Multiple      Live Births               Home Medications    Prior to Admission medications   Medication Sig Start Date End Date Taking? Authorizing Provider  fluconazole (DIFLUCAN) 150 MG tablet Take 1 tablet (150 mg total) by mouth daily. 12/31/23  Yes Particia Nearing, PA-C  clonazePAM (KLONOPIN) 1 MG tablet Take 1 mg by mouth 2 (two) times daily as needed. 06/04/21   [provider]  cloNIDine (CATAPRES) 0.1 MG tablet TAKE 1 TABLET BY MOUTH AT BEDTIME FOR SLEEP 01/25/19   [provider]  DULoxetine (CYMBALTA) 60 MG capsule Take 120 mg by mouth daily.    [provider]  etonogestrel (NEXPLANON) 68 MG IMPL implant 1 each by Subdermal route once. 05/2013    [provider]  gabapentin (NEURONTIN) 600 MG tablet Take 1,800 mg by mouth daily. 01/25/19   [provider]  lisdexamfetamine (VYVANSE) 30 MG capsule Take 30 mg by mouth daily.    [provider]  metroNIDAZOLE (FLAGYL) 500 MG tablet Take 1 tablet (500  mg total) by mouth 2 (two) times daily. 05/13/23   Myna Hidalgo, DO  predniSONE (STERAPRED UNI-PAK 21 TAB) 10 MG (21) TBPK tablet take 60mg  day 1, then 50mg  day 2, then 40mg  day 3, then 30mg  day 4, then 20mg  day 5, then 10mg  day 6, then STOP 05/29/23   Drema Dallas, DO    Family History Family History  Problem Relation Age of Onset   Migraines Mother    Depression Mother    Cancer Father    Migraines Maternal Aunt    Migraines Maternal Grandmother    Cancer Other    Diabetes Other     Social History Social History   Tobacco Use   Smoking status: Former    Current packs/day: 1.00    Types: Cigarettes   Smokeless tobacco: Never  Vaping Use   Vaping status: Some Days  Substance Use Topics   Alcohol use: Not Currently    Comment: occas   Drug use: Yes    Types: Marijuana    Comment: occ     Allergies   Celebrex [celecoxib] and Sulfa  antibiotics   Review of Systems Review of Systems PER HPI  Physical Exam Triage Vital Signs ED Triage Vitals  Encounter Vitals Group     BP 12/31/23 1318 (!) 137/93     Systolic BP Percentile --      Diastolic BP Percentile --      Pulse Rate 12/31/23 1318 (!) 113     Resp 12/31/23 1318 20     Temp 12/31/23 1318 98.6 F (37 C)     Temp Source 12/31/23 1318 Oral     SpO2 12/31/23 1318 98 %     Weight --      Height --      Head Circumference --      Peak Flow --      Pain Score 12/31/23 1330 4     Pain Loc --      Pain Education --      Exclude from Growth Chart --    No data found.  Updated Vital Signs BP (!) 137/93 (BP Location: Right Arm)   Pulse (!) 113   Temp 98.6 F (37 C) (Oral)   Resp 20   SpO2 98%   Visual Acuity Right Eye Distance:   Left Eye Distance:   Bilateral Distance:    Right Eye Near:   Left Eye Near:    Bilateral Near:     Physical Exam Vitals and nursing note reviewed.  Constitutional:      Appearance: Normal appearance. She is not ill-appearing.  HENT:     Head: Atraumatic.  Eyes:     Extraocular Movements: Extraocular movements intact.     Conjunctiva/sclera: Conjunctivae normal.  Cardiovascular:     Rate and Rhythm: Normal rate.  Pulmonary:     Effort: Pulmonary effort is normal.  Musculoskeletal:        General: Swelling and tenderness present. Normal range of motion.     Cervical back: Normal range of motion and neck supple.     Comments: Trace edema to the right third finger and third MCP, tender to palpation in these areas.  No bony deformity palpable  Skin:    General: Skin is warm and dry.     Findings: No bruising or erythema.     Comments: Ingrown toenail right great toe without evidence of erythema, drainage, rashes  Neurological:     Mental Status: She is alert  and oriented to person, place, and time.     Motor: No weakness.     Gait: Gait normal.     Comments: All 4 extremities neurovascularly intact   Psychiatric:        Mood and Affect: Mood normal.        Thought Content: Thought content normal.        Judgment: Judgment normal.      UC Treatments / Results  Labs (all labs ordered are listed, but only abnormal results are displayed) Labs Reviewed  CERVICOVAGINAL ANCILLARY ONLY    EKG   Radiology DG Hand Complete Right Result Date: 12/31/2023 CLINICAL DATA:  Right hand pain for 1 day. EXAM: RIGHT HAND - COMPLETE 3+ VIEW COMPARISON:  None Available. FINDINGS: There is no evidence of fracture or dislocation. There is no evidence of arthropathy or other focal bone abnormality. Soft tissues are unremarkable. IMPRESSION: Negative. Electronically Signed   By: Lupita Raider M.D.   On: 12/31/2023 14:51    Procedures Procedures (including critical care time)  Medications Ordered in UC Medications  dexamethasone (DECADRON) injection 10 mg (10 mg Intramuscular Given 12/31/23 1450)    Initial Impression / Assessment and Plan / UC Course  I have reviewed the triage vital signs and the nursing notes.  Pertinent labs & imaging results that were available during my care of the patient were reviewed by me and considered in my medical decision making (see chart for details).     Suspect RA flareup in the right hand.  X-ray negative for acute bony abnormality, treat with IM Decadron, supportive over-the-counter medications, home care.  Regarding her vaginitis, vaginal swab pending, treat with Diflucan, supportive over-the-counter remedies in the meantime and adjust if needed based on results.  Regarding her ingrown toenail, does not appear to be acutely infected.  Continue good home wound care and follow-up with podiatry if not resolving.  Final Clinical Impressions(s) / UC Diagnoses   Final diagnoses:  Right hand pain  Acute vaginitis  Ingrown toenail     Discharge Instructions      We have sent out a vaginal for further evaluation of your vaginal discharge and itching.  I  have sent a dose of each medication for you to try in the meantime.  We have given you a steroid shot to help with pain and inflammation in your hand.  You may also take Tylenol and do Epsom salt soaks, elevation, ice.  Regarding your toe, it does not appear to be infected at this time so continue good home care and follow-up with [facility] if not resolving.    ED Prescriptions     Medication Sig Dispense Auth. Provider   fluconazole (DIFLUCAN) 150 MG tablet Take 1 tablet (150 mg total) by mouth daily. 1 tablet Particia Nearing, [place]      PDMP not reviewed this encounter.   Particia Nearing, [place] 12/31/23 1501

## 2023-12-31 NOTE — ED Triage Notes (Addendum)
 Pt reports right hand pain since this am. Pt reports history of similar and denies any known injury.   Pt also reports vaginal itching and states hx of bv and recently finished antibiotics beginning of march.  Swab obtained post triage.

## 2023-12-31 NOTE — ED Notes (Signed)
 Applied dressing to patients right big toes.   Mupiricin, non adherent, and white gauze wrap.

## 2023-12-31 NOTE — Discharge Instructions (Signed)
 We have sent out a vaginal for further evaluation of your vaginal discharge and itching.  I have sent a dose of each medication for you to try in the meantime.  We have given you a steroid shot to help with pain and inflammation in your hand.  You may also take Tylenol and do Epsom salt soaks, elevation, ice.  Regarding your toe, it does not appear to be infected at this time so continue good home care and follow-up with [facility] if not resolving.

## 2024-01-01 ENCOUNTER — Telehealth: Payer: Self-pay

## 2024-01-01 LAB — CERVICOVAGINAL ANCILLARY ONLY
Bacterial Vaginitis (gardnerella): POSITIVE — AB
Candida Glabrata: NEGATIVE
Candida Vaginitis: NEGATIVE
Comment: NEGATIVE
Comment: NEGATIVE
Comment: NEGATIVE

## 2024-01-01 MED ORDER — METRONIDAZOLE 500 MG PO TABS
500.0000 mg | ORAL_TABLET | Freq: Two times a day (BID) | ORAL | 0 refills | Status: AC
Start: 2024-01-01 — End: 2024-01-08

## 2024-01-01 NOTE — Telephone Encounter (Signed)
 Per protocol, pt requires tx with metronidazole. Rx sent to pharmacy on file.

## 2024-01-05 ENCOUNTER — Ambulatory Visit: Payer: Self-pay | Admitting: Neurology

## 2024-01-05 ENCOUNTER — Other Ambulatory Visit (HOSPITAL_COMMUNITY)

## 2024-01-05 ENCOUNTER — Encounter: Payer: Self-pay | Admitting: Neurology

## 2024-01-06 ENCOUNTER — Ambulatory Visit: Admitting: Orthopedic Surgery

## 2024-01-07 ENCOUNTER — Encounter: Payer: Self-pay | Admitting: Emergency Medicine

## 2024-01-07 ENCOUNTER — Ambulatory Visit
Admission: EM | Admit: 2024-01-07 | Discharge: 2024-01-07 | Disposition: A | Discharge status: Home / Self Care | Attending: Nurse Practitioner | Admitting: Nurse Practitioner

## 2024-01-07 ENCOUNTER — Ambulatory Visit

## 2024-01-07 DIAGNOSIS — B009 Herpesviral infection, unspecified: Secondary | ICD-10-CM

## 2024-01-07 DIAGNOSIS — N898 Other specified noninflammatory disorders of vagina: Secondary | ICD-10-CM | POA: Insufficient documentation

## 2024-01-07 DIAGNOSIS — R894 Abnormal immunological findings in specimens from other organs, systems and tissues: Secondary | ICD-10-CM | POA: Insufficient documentation

## 2024-01-07 NOTE — ED Provider Notes (Signed)
 [facility]    CSN: [id number] Arrival date & time: 01/07/24  1718      History   Chief Complaint No chief complaint on file.   HPI Barbara Koch is a 38 y.o. female.   The history is provided by the patient.   Patient presents requesting an HSV culture.  Patient states she was seen at another urgent care and was told that she tested positive for herpes via blood work.  She was told by the urgent care that they did not perform HSV culture swabs.  Patient was concerned because she has a "bump" on her labia that is been present for the past 2 days.  Patient states that she also recently tested positive for BV, but she has not started the medication.  Patient states she is concerned that the positive herpes test at the other urgent care was not accurate.  Patient informs that the other urgent care has sent a prescription for Valtrex for her to begin.  Patient is not concerned for pregnancy.    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   Rheumatoid arthritis [facility])     Patient Active Problem List   Diagnosis Date Noted   Chronic migraine with aura without status migrainosus, not intractable 10/01/2023   Visit for routine gyn exam 03/23/2023   STD exposure 03/23/2023   Nexplanon in place 03/23/2023    Past Surgical History:  Procedure Laterality Date   dislocated shoulder     right    OB History     Gravida  3   Para  1   Term  1   Preterm      AB  2   Living         SAB      IAB  2   Ectopic      Multiple      Live Births               Home Medications    Prior to Admission medications   Medication Sig Start Date End Date Taking? Authorizing Provider  clonazePAM (KLONOPIN) 1 MG tablet Take 1 mg by mouth 2 (two) times daily as needed. 06/04/21   [provider]  cloNIDine (CATAPRES) 0.1 MG  tablet TAKE 1 TABLET BY MOUTH AT BEDTIME FOR SLEEP 01/25/19   [provider]  DULoxetine (CYMBALTA) 60 MG capsule Take 120 mg by mouth daily.    [provider]  etonogestrel (NEXPLANON) 68 MG IMPL implant 1 each by Subdermal route once. 05/2013    [provider]  gabapentin (NEURONTIN) 600 MG tablet Take 1,800 mg by mouth daily. 01/25/19   [provider]  lisdexamfetamine (VYVANSE) 30 MG capsule Take 30 mg by mouth daily.    [provider]  metroNIDAZOLE (FLAGYL) 500 MG tablet Take 1 tablet (500 mg total) by mouth 2 (two) times daily for 7 days. 01/01/24 01/08/24  Zenia Resides, MD    Family History Family History  Problem Relation Age of Onset   Migraines Mother    Depression Mother    Cancer Father    Migraines Maternal Aunt    Migraines Maternal Grandmother    Cancer Other    Diabetes Other     Social History Social History   Tobacco Use  Smoking status: Former    Current packs/day: 1.00    Types: Cigarettes   Smokeless tobacco: Never  Vaping Use   Vaping status: Some Days  Substance Use Topics   Alcohol use: Not Currently    Comment: occas   Drug use: Yes    Types: Marijuana    Comment: occ     Allergies   Celebrex [celecoxib] and Sulfa antibiotics   Review of Systems Review of Systems Per HPI  Physical Exam Triage Vital Signs ED Triage Vitals  Encounter Vitals Group     BP 01/07/24 1727 (!) 143/89     Systolic BP Percentile --      Diastolic BP Percentile --      Pulse Rate 01/07/24 1727 90     Resp 01/07/24 1727 18     Temp 01/07/24 1727 98.4 F (36.9 C)     Temp Source 01/07/24 1727 Oral     SpO2 01/07/24 1727 98 %     Weight --      Height --      Head Circumference --      Peak Flow --      Pain Score 01/07/24 1731 3     Pain Loc --      Pain Education --      Exclude from Growth Chart --    No data found.  Updated Vital Signs BP (!) 143/89 (BP Location: Right Arm)   Pulse 90   Temp  98.4 F (36.9 C) (Oral)   Resp 18   SpO2 98%   Visual Acuity Right Eye Distance:   Left Eye Distance:   Bilateral Distance:    Right Eye Near:   Left Eye Near:    Bilateral Near:     Physical Exam Vitals and nursing note reviewed. Exam conducted with a chaperone present Darl Pikes, RN).  Constitutional:      General: She is not in acute distress.    Appearance: Normal appearance.  HENT:     Head: Normocephalic.  Eyes:     Pupils: Pupils are equal, round, and reactive to light.  Pulmonary:     Effort: Pulmonary effort is normal.  Genitourinary:    Exam position: Lithotomy position.     Labia:        Right: Tenderness and lesion present.        Left: No rash, tenderness, lesion or injury.      Comments: Lesion noted to right labia.  HSV swab collected. Musculoskeletal:     Cervical back: Normal range of motion.  Skin:    General: Skin is warm and dry.  Neurological:     General: No focal deficit present.     Mental Status: She is alert and oriented to person, place, and time.  Psychiatric:        Mood and Affect: Mood normal.        Behavior: Behavior normal.      UC Treatments / Results  Labs (all labs ordered are listed, but only abnormal results are displayed) Labs Reviewed  HSV CULTURE AND TYPING    EKG   Radiology No results found.  Procedures Procedures (including critical care time)  Medications Ordered in UC Medications - No data to display  Initial Impression / Assessment and Plan / UC Course  I have reviewed the triage vital signs and the nursing notes.  Pertinent labs & imaging results that were available during my care of the patient were reviewed by me and  considered in my medical decision making (see chart for details).  HSV culture swab is pending.  In the interim, advised patient to begin Valtrex previously prescribed by the other urgent care she was seen at, along with beginning the Flagyl for BV.  Supportive care recommendations were  provided and discussed with the patient to include refraining from sexual intercourse while symptoms persist, and notifying all partners if the result is positive.  Patient was advised to follow-up with gynecology for an annual pelvic exam.  Patient was in agreement with this plan of care and verbalized understanding.  All questions were answered.  Patient stable for discharge.  Final Clinical Impressions(s) / UC Diagnoses   Final diagnoses:  Vaginal lesion  Herpes simplex     Discharge Instructions      Begin the medication prescribed by the other urgent care.  Also start the medication for BV previously prescribed. Avoid sexual intercourse while symptoms persist.  You also need to refrain from sexual intercourse for an additional 7 days after completing treatment. Recommend warm sitz bath's or applying cool compresses to the affected area for pain or discomfort. You will be contacted if the pending test results are abnormal.  You also have access to results via MyChart. Follow-up as needed.     ED Prescriptions   None    PDMP not reviewed this encounter.   Abran Cantor, NP 01/07/24 1759

## 2024-01-07 NOTE — ED Triage Notes (Signed)
 Was told she was herpes positive during an urgent care visit in February. States she doesn't feel like this was accurate.  States she has a bump on her labia x 2 days.  Went back to that same urgent care and was told they did not have swabs to check for herpes.

## 2024-01-07 NOTE — Discharge Instructions (Signed)
 Begin the medication prescribed by the other urgent care.  Also start the medication for BV previously prescribed. Avoid sexual intercourse while symptoms persist.  You also need to refrain from sexual intercourse for an additional 7 days after completing treatment. Recommend warm sitz bath's or applying cool compresses to the affected area for pain or discomfort. You will be contacted if the pending test results are abnormal.  You also have access to results via MyChart. Follow-up as needed.

## 2024-01-10 LAB — HSV CULTURE AND TYPING

## 2024-01-18 ENCOUNTER — Ambulatory Visit: Payer: No Typology Code available for payment source | Admitting: Physician Assistant

## 2024-01-29 ENCOUNTER — Encounter: Payer: Self-pay | Admitting: Neurology

## 2024-02-01 ENCOUNTER — Telehealth: Payer: Self-pay

## 2024-02-01 NOTE — Telephone Encounter (Addendum)
 Auth Submission: NO AUTH NEEDED Site of care: Site of care: [facility] Payer: [place] [employer]  Medication & CPT/J Code(s) submitted: Vyepti (Eptinezumab) [id number] Route of submission (phone, fax, portal): PHONE Phone #[phone] Fax # Auth type: Buy/Bill HB Units/visits requested: 300mg , q3 months Reference number: [id number] Approval from: 02/01/24 to 10/05/24    Per [facility], pt is using medicaid to cover this medication and for [facility] to cover it, pt would need to reach out to her [facility] provider due to missed consults.  Thanks

## 2024-03-28 ENCOUNTER — Ambulatory Visit: Admitting: Family Medicine

## 2024-05-05 ENCOUNTER — Encounter: Payer: Self-pay | Admitting: Neurology

## 2024-05-10 ENCOUNTER — Other Ambulatory Visit (HOSPITAL_COMMUNITY): Payer: Self-pay | Admitting: Nurse Practitioner

## 2024-05-10 ENCOUNTER — Ambulatory Visit: Admitting: Podiatry

## 2024-05-10 DIAGNOSIS — Z1231 Encounter for screening mammogram for malignant neoplasm of breast: Secondary | ICD-10-CM

## 2024-05-13 ENCOUNTER — Encounter: Payer: Self-pay | Admitting: Neurology

## 2024-05-18 ENCOUNTER — Ambulatory Visit: Admitting: Podiatry

## 2024-05-18 ENCOUNTER — Encounter: Payer: Self-pay | Admitting: Neurology

## 2024-05-23 ENCOUNTER — Ambulatory Visit (HOSPITAL_COMMUNITY)
Admission: RE | Admit: 2024-05-23 | Discharge: 2024-05-23 | Disposition: A | Discharge status: Home / Self Care | Source: Ambulatory Visit | Attending: Nurse Practitioner | Admitting: Nurse Practitioner

## 2024-05-23 ENCOUNTER — Other Ambulatory Visit (HOSPITAL_COMMUNITY): Payer: Self-pay | Admitting: Nurse Practitioner

## 2024-05-23 DIAGNOSIS — Z1231 Encounter for screening mammogram for malignant neoplasm of breast: Secondary | ICD-10-CM

## 2024-05-25 ENCOUNTER — Other Ambulatory Visit (HOSPITAL_COMMUNITY)
Admission: RE | Admit: 2024-05-25 | Discharge: 2024-05-25 | Disposition: A | Discharge status: Home / Self Care | Source: Ambulatory Visit | Attending: Advanced Practice Midwife | Admitting: Advanced Practice Midwife

## 2024-05-25 ENCOUNTER — Telehealth: Payer: Self-pay

## 2024-05-25 ENCOUNTER — Encounter: Payer: Self-pay | Admitting: Advanced Practice Midwife

## 2024-05-25 ENCOUNTER — Ambulatory Visit: Admitting: Advanced Practice Midwife

## 2024-05-25 VITALS — BP 136/91 | HR 124 | Ht 60.0 in | Wt 193.4 lb

## 2024-05-25 DIAGNOSIS — I10 Essential (primary) hypertension: Secondary | ICD-10-CM | POA: Diagnosis not present

## 2024-05-25 DIAGNOSIS — Z3046 Encounter for surveillance of implantable subdermal contraceptive: Secondary | ICD-10-CM

## 2024-05-25 DIAGNOSIS — Z01419 Encounter for gynecological examination (general) (routine) without abnormal findings: Secondary | ICD-10-CM

## 2024-05-25 DIAGNOSIS — R87610 Atypical squamous cells of undetermined significance on cytologic smear of cervix (ASC-US): Secondary | ICD-10-CM | POA: Diagnosis not present

## 2024-05-25 DIAGNOSIS — R87619 Unspecified abnormal cytological findings in specimens from cervix uteri: Secondary | ICD-10-CM | POA: Insufficient documentation

## 2024-05-25 NOTE — Progress Notes (Signed)
 WELL-WOMAN EXAMINATION Patient name: Barbara Koch MRN [id number]  Date of birth: [birth year] Chief Complaint:   Gynecologic Exam (Pap last pap 03-23-2023 ASC-US .nexplanon removal)  History of Present Illness:   Barbara Koch is a 38 y.o. G57P1020 Caucasian female being seen today for a routine well-woman exam.  Current complaints: requesting Nexplanon removal as it will have been 3 years since placement next month; concerned re peri-menopausal symptoms and what to expect with Nex removal; is currently abstinent, but it considering ideas for cycle-control to carry her through menopause (isn't currently having peri symptoms)  PCP: Burnard Riding FNP  ([facility]) does not desire labs No LMP recorded. Patient has had an implant. The current method of family planning is abstinence.  Last pap June 2024. Results were: ASCUS w/ HRHPV positive: other (not 16, 18/45). H/O abnormal pap: yes had colpo/bx Aug 2024 neg for dysplasia Last mammogram: 05/23/24. Results were: normal. Family h/o breast cancer: no Last colonoscopy: never. Results were: N/A. Family h/o colorectal cancer: no     05/25/2024    2:58 PM 03/23/2023    3:17 PM  Depression screen PHQ 2/9  Decreased Interest 3 3  Down, Depressed, Hopeless 3 3  PHQ - 2 Score 6 6  Altered sleeping 3 3  Tired, decreased energy 3 3  Change in appetite 3 3  Feeling bad or failure about yourself  3 3  Trouble concentrating 3 3  Moving slowly or fidgety/restless 2 3  Suicidal thoughts 3 1  PHQ-9 Score 26 25        05/25/2024    2:59 PM 03/23/2023    3:18 PM  GAD 7 : Generalized Anxiety Score  Nervous, Anxious, on Edge 3 3  Control/stop worrying 3 3  Worry too much - different things 3 3  Trouble relaxing 3 3  Restless 2 3  Easily annoyed or irritable 2 3  Afraid - awful might happen  2  Total GAD 7 Score  20     Review of Systems:   Pertinent items are noted in HPI Denies any headaches, blurred vision, fatigue, shortness of breath, chest pain,  abdominal pain, abnormal vaginal discharge/itching/odor/irritation, problems with periods, bowel movements, urination, or intercourse unless otherwise stated above. Pertinent History Reviewed:  Reviewed past medical,surgical, social and family history.  Reviewed problem list, medications and allergies. Physical Assessment:   Vitals:   05/25/24 1452  BP: (!) 136/91  Pulse: (!) 124  Weight: 193 lb 6.4 oz (87.7 kg)  Height: 5' (1.524 m)  Body mass index is 37.77 kg/m.        Physical Examination:   General appearance - well appearing, and in no distress  Mental status - alert, oriented to person, place, and time  Psych:  She has a normal mood and affect  Skin - warm and dry, normal color, no suspicious lesions noted  Chest - effort normal, all lung fields clear to auscultation bilaterally  Heart - normal rate and regular rhythm  Neck:  midline trachea, no thyromegaly or nodules  Breasts - not examined; mammo 2 days ago  Abdomen - soft, nontender, nondistended, no masses or organomegaly  Pelvic - VULVA: normal appearing vulva with no masses, tenderness or lesions  VAGINA: normal appearing vagina with normal color and discharge, no lesions  CERVIX: normal appearing cervix without discharge or lesions, no CMT  Thin prep pap is done with HR HPV cotesting  UTERUS: uterus is felt to be normal size, shape, consistency and nontender  ADNEXA: No adnexal masses or tenderness noted.  Rectal - not examined  Extremities:  No swelling or varicosities noted  Chaperone: Peggy Dones  No results found for this or any previous visit (from the past 24 hours).  Assessment & Plan:  1) Well-Woman Exam  2) Cycle control/contraceptive management, Nexplanon removed; requesting NuvaRing, however she has cHTN on meds with some mild range elevations as well as migraines; we discussed that estrogen would not be recommended in this case; encouraged IUD if her desire is for something to carry her through  menopause> she is unsure  and will consider options and let us  know  Labs/procedures today:   NEXPLANON REMOVAL Her Nexplanon was placed September 2022.  She desires removal because of expiration. Signed copy of informed consent in chart.  Time out was performed.  Nexplanon site identified on L arm.  Area prepped in usual sterile fashon. Two ccs of 2% lidocaine  was used to anesthetize the area at the distal end of the implant. A small stab incision was made right beside the implant on the distal portion.  The Nexplanon rod was grasped using hemostats and removed without difficulty.  There was less than 3 cc blood loss. There were no complications.  Steri-strips were applied over the small incision and a pressure bandage was applied.  The patient tolerated the procedure well. Assessment & Plan:   1) Nexplanon removal She was instructed to keep the area clean and dry, remove pressure bandage in 24 hours, and keep insertion site covered with the steri-strip for 3-5 days.   Follow-up PRN problems.  No orders of the defined types were placed in this encounter.  Mammogram: in 1 year, or sooner if problems Colonoscopy: @ 38yo, or sooner if problems  No orders of the defined types were placed in this encounter.   Meds: No orders of the defined types were placed in this encounter.   Follow-up: Return for prn.  Suzen JONETTA Gentry CNM 05/25/2024 10:29 PM

## 2024-05-25 NOTE — Telephone Encounter (Signed)
 Patient was seen today and had her Nexplanon removed; patient wants the NuvarRing called for her birth control method.

## 2024-05-26 ENCOUNTER — Other Ambulatory Visit: Payer: Self-pay | Admitting: Advanced Practice Midwife

## 2024-05-26 MED ORDER — SLYND 4 MG PO TABS
1.0000 | ORAL_TABLET | Freq: Every day | ORAL | 4 refills | Status: AC
Start: 2024-05-26 — End: ?

## 2024-05-31 LAB — CYTOLOGY - PAP
Chlamydia: NEGATIVE
Comment: NEGATIVE
Comment: NEGATIVE
Comment: NORMAL
Diagnosis: UNDETERMINED — AB
High risk HPV: NEGATIVE
Neisseria Gonorrhea: NEGATIVE

## 2024-06-01 ENCOUNTER — Ambulatory Visit: Payer: Self-pay | Admitting: Advanced Practice Midwife

## 2024-07-20 ENCOUNTER — Other Ambulatory Visit: Payer: Self-pay | Admitting: Medical Genetics

## 2024-07-20 DIAGNOSIS — Z006 Encounter for examination for normal comparison and control in clinical research program: Secondary | ICD-10-CM

## 2024-09-16 ENCOUNTER — Encounter: Payer: Self-pay | Admitting: Neurology

## 2024-09-16 ENCOUNTER — Ambulatory Visit
Admission: EM | Admit: 2024-09-16 | Discharge: 2024-09-16 | Disposition: A | Discharge status: Home / Self Care | Attending: Family Medicine | Admitting: Family Medicine

## 2024-09-16 ENCOUNTER — Encounter: Payer: Self-pay | Admitting: Emergency Medicine

## 2024-09-16 DIAGNOSIS — Z113 Encounter for screening for infections with a predominantly sexual mode of transmission: Secondary | ICD-10-CM | POA: Diagnosis not present

## 2024-09-16 DIAGNOSIS — J069 Acute upper respiratory infection, unspecified: Secondary | ICD-10-CM

## 2024-09-16 DIAGNOSIS — R059 Cough, unspecified: Secondary | ICD-10-CM | POA: Diagnosis present

## 2024-09-16 LAB — POC COVID19/FLU A&B COMBO
Covid Antigen, POC: NEGATIVE
Influenza A Antigen, POC: NEGATIVE
Influenza B Antigen, POC: NEGATIVE

## 2024-09-16 MED ORDER — AZELASTINE HCL 0.1 % NA SOLN: 1.0000 | Freq: Two times a day (BID) | NASAL | 0 refills | Status: AC

## 2024-09-16 MED ORDER — PROMETHAZINE-DM 6.25-15 MG/5ML PO SYRP: 5.0000 mL | ORAL_SOLUTION | Freq: Four times a day (QID) | ORAL | 0 refills | Status: AC | PRN

## 2024-09-16 NOTE — ED Provider Notes (Signed)
 [facility]    CSN: [id number] Arrival date & time: 09/16/24  1240      History   Chief Complaint Chief Complaint  Patient presents with   Cough    HPI Barbara Koch is a 38 y.o. female.   Patient presenting today with 3-day history of congestion, cough, ear pressure, scratchy throat.  Denies fever, chills, chest pain, shortness of breath, abdominal pain, vomiting, diarrhea.  So far trying over-the-counter cold and congestion medication with minimal relief.  Also requesting STI screening.  Denies any new known exposures or symptoms currently but stated that she had had unprotected intercourse recently.    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   Rheumatoid arthritis [facility])     Patient Active Problem List   Diagnosis Date Noted   Abnormal Pap smear of cervix 05/25/2024   Chronic hypertension 05/25/2024   Chronic migraine with aura without status migrainosus, not intractable 10/01/2023   STD exposure 03/23/2023    Past Surgical History:  Procedure Laterality Date   dislocated shoulder     right    OB History     Gravida  3   Para  1   Term  1   Preterm      AB  2   Living         SAB      IAB  2   Ectopic      Multiple      Live Births               Home Medications    Prior to Admission medications  Medication Sig Start Date End Date Taking? Authorizing Provider  ALPRAZolam (XANAX) 1 MG tablet Take 1 mg by mouth 2 (two) times daily.   Yes [provider]  azelastine (ASTELIN) 0.1 % nasal spray Place 1 spray into both nostrils 2 (two) times daily. Use in each nostril as directed 09/16/24  Yes Stuart Vernell Norris, PA-C  Drospirenone  (SLYND ) 4 MG TABS  Take 1 tablet (4 mg total) by mouth daily. 05/26/24   Loreli Suzen BIRCH, CNM  promethazine-dextromethorphan (PROMETHAZINE-DM) 6.25-15 MG/5ML syrup Take 5 mLs by mouth 4 (four) times daily as needed. 09/16/24  Yes Stuart Vernell Norris, PA-C  cloNIDine (CATAPRES) 0.1 MG tablet TAKE 1 TABLET BY MOUTH AT BEDTIME FOR SLEEP 01/25/19   [provider]  DULoxetine (CYMBALTA) 60 MG capsule Take 120 mg by mouth daily.    [provider]  gabapentin (NEURONTIN) 600 MG tablet Take 1,800 mg by mouth daily. 01/25/19   [provider]  lisdexamfetamine (VYVANSE) 30 MG capsule Take 30 mg by mouth daily.    [provider]    Family History Family History  Problem Relation Age of Onset   Migraines Mother    Depression Mother    Cancer Father    Migraines Maternal Aunt    Migraines Maternal Grandmother    Cancer Other    Diabetes Other     Social History Social History[1]   Allergies   Celebrex [celecoxib] and Sulfa antibiotics   Review of Systems Review of Systems PER HPI  Physical Exam  Triage Vital Signs ED Triage Vitals  Encounter Vitals Group     BP 09/16/24 1345 (!) 146/101     Girls Systolic BP Percentile --      Girls Diastolic BP Percentile --      Boys Systolic BP Percentile --      Boys Diastolic BP Percentile --      Pulse Rate 09/16/24 1345 86     Resp 09/16/24 1345 18     Temp 09/16/24 1345 98.1 F (36.7 C)     Temp Source 09/16/24 1345 Oral     SpO2 09/16/24 1345 96 %     Weight --      Height --      Head Circumference --      Peak Flow --      Pain Score 09/16/24 1344 5     Pain Loc --      Pain Education --      Exclude from Growth Chart --    No data found.  Updated Vital Signs BP (!) 146/101 (BP Location: Right Arm)   Pulse 86   Temp 98.1 F (36.7 C) (Oral)   Resp 18   LMP 09/12/2024 (Exact Date)   SpO2 96%   Visual Acuity Right Eye Distance:   Left Eye Distance:   Bilateral Distance:    Right Eye Near:   Left  Eye Near:    Bilateral Near:     Physical Exam Vitals and nursing note reviewed.  Constitutional:      Appearance: Normal appearance.  HENT:     Head: Atraumatic.     Right Ear: Tympanic membrane and external ear normal.     Left Ear: Tympanic membrane and external ear normal.     Nose: Rhinorrhea present.     Mouth/Throat:     Mouth: Mucous membranes are moist.     Pharynx: Posterior oropharyngeal erythema present.  Eyes:     Extraocular Movements: Extraocular movements intact.     Conjunctiva/sclera: Conjunctivae normal.  Cardiovascular:     Rate and Rhythm: Normal rate and regular rhythm.     Heart sounds: Normal heart sounds.  Pulmonary:     Effort: Pulmonary effort is normal.     Breath sounds: Normal breath sounds. No wheezing.  Genitourinary:    Comments: GU exam deferred, self swab performed Musculoskeletal:        General: Normal range of motion.     Cervical back: Normal range of motion and neck supple.  Skin:    General: Skin is warm and dry.  Neurological:     Mental Status: She is alert and oriented to person, place, and time.  Psychiatric:        Mood and Affect: Mood normal.        Thought Content: Thought content normal.      UC Treatments / Results  Labs (all labs ordered are listed, but only abnormal results are displayed) Labs Reviewed  POC COVID19/FLU A&B COMBO - Normal  HIV ANTIBODY (ROUTINE TESTING W REFLEX)  SYPHILIS: RPR W/REFLEX TO RPR TITER AND TREPONEMAL ANTIBODIES, TRADITIONAL SCREENING AND DIAGNOSIS ALGORITHM  CERVICOVAGINAL ANCILLARY ONLY    EKG   Radiology No results found.  Procedures Procedures (including critical care time)  Medications Ordered in UC Medications - No data to display  Initial Impression / Assessment and Plan / UC Course  I have reviewed the triage vital signs and the nursing notes.  Pertinent labs & imaging results that were available  during my care of the patient were reviewed by me and considered in  my medical decision making (see chart for details).     Mildly hypertensive in triage, otherwise vital signs reassuring.  Rapid flu and COVID-negative, consistent with viral respiratory infection.  Will treat with Astelin, Phenergan DM, supportive over-the-counter medications and home care.  STI screening pending per her request, treat based on symptoms.  Final Clinical Impressions(s) / UC Diagnoses   Final diagnoses:  Screening examination for STI  Viral URI with cough     Discharge Instructions      We will let you know if anything comes back abnormal on your STD screening panel.    ED Prescriptions     Medication Sig Dispense Auth. Provider   azelastine (ASTELIN) 0.1 % nasal spray Place 1 spray into both nostrils 2 (two) times daily. Use in each nostril as directed 30 mL Stuart Vernell Norris, PA-C   promethazine-dextromethorphan (PROMETHAZINE-DM) 6.25-15 MG/5ML syrup Take 5 mLs by mouth 4 (four) times daily as needed. 100 mL Stuart Vernell Norris, [place]      PDMP not reviewed this encounter.    [1]  Social History Tobacco Use   Smoking status: Former    Current packs/day: 1.00    Types: Cigarettes   Smokeless tobacco: Never  Vaping Use   Vaping status: Some Days  Substance Use Topics   Alcohol use: Not Currently    Comment: occas   Drug use: Yes    Types: Marijuana    Comment: occ     Stuart Vernell Norris, PA-C 09/16/24 1652

## 2024-09-16 NOTE — ED Triage Notes (Signed)
 Cough, chest congestion, ear pressure, sore throat x 3 days.

## 2024-09-16 NOTE — Discharge Instructions (Signed)
 We will let you know if anything comes back abnormal on your STD screening panel.

## 2024-09-19 LAB — CERVICOVAGINAL ANCILLARY ONLY
Chlamydia: NEGATIVE
Comment: NEGATIVE
Comment: NEGATIVE
Comment: NORMAL
Neisseria Gonorrhea: NEGATIVE
Trichomonas: NEGATIVE

## 2024-09-20 LAB — SYPHILIS: RPR W/REFLEX TO RPR TITER AND TREPONEMAL ANTIBODIES, TRADITIONAL SCREENING AND DIAGNOSIS ALGORITHM: RPR Ser Ql: NONREACTIVE

## 2024-09-20 LAB — HIV ANTIBODY (ROUTINE TESTING W REFLEX)

## 2024-09-23 ENCOUNTER — Ambulatory Visit (HOSPITAL_COMMUNITY): Payer: Self-pay

## 2024-10-07 NOTE — Addendum Note (Signed)
 Addended by: DAYNE SHERRY RAMAN on: 10/07/2024 08:05 AM   Modules accepted: Orders
